# Patient Record
Sex: Female | Born: 1990
Health system: Southern US, Community
[De-identification: ages and names within clinical notes are randomized; demographics above are authoritative.]

## PROBLEM LIST (undated history)

## (undated) DIAGNOSIS — T7840XA Allergy, unspecified, initial encounter: Secondary | ICD-10-CM

## (undated) DIAGNOSIS — R7611 Nonspecific reaction to tuberculin skin test without active tuberculosis: Secondary | ICD-10-CM

## (undated) DIAGNOSIS — N920 Excessive and frequent menstruation with regular cycle: Secondary | ICD-10-CM

## (undated) DIAGNOSIS — D649 Anemia, unspecified: Secondary | ICD-10-CM

## (undated) DIAGNOSIS — D1803 Hemangioma of intra-abdominal structures: Secondary | ICD-10-CM

## (undated) DIAGNOSIS — Z973 Presence of spectacles and contact lenses: Secondary | ICD-10-CM

## (undated) DIAGNOSIS — J302 Other seasonal allergic rhinitis: Secondary | ICD-10-CM

## (undated) DIAGNOSIS — B019 Varicella without complication: Secondary | ICD-10-CM

## (undated) DIAGNOSIS — D573 Sickle-cell trait: Secondary | ICD-10-CM

## (undated) DIAGNOSIS — D509 Iron deficiency anemia, unspecified: Secondary | ICD-10-CM

## (undated) DIAGNOSIS — D251 Intramural leiomyoma of uterus: Secondary | ICD-10-CM

## (undated) DIAGNOSIS — F329 Major depressive disorder, single episode, unspecified: Secondary | ICD-10-CM

## (undated) DIAGNOSIS — F411 Generalized anxiety disorder: Secondary | ICD-10-CM

## (undated) DIAGNOSIS — E282 Polycystic ovarian syndrome: Secondary | ICD-10-CM

## (undated) DIAGNOSIS — F909 Attention-deficit hyperactivity disorder, unspecified type: Secondary | ICD-10-CM

## (undated) HISTORY — DX: Allergy, unspecified, initial encounter: T78.40XA

## (undated) HISTORY — PX: HERNIA REPAIR: SHX51

## (undated) HISTORY — DX: Sickle-cell trait: D57.3

## (undated) HISTORY — DX: Nonspecific reaction to tuberculin skin test without active tuberculosis: R76.11

## (undated) HISTORY — DX: Varicella without complication: B01.9

## (undated) HISTORY — DX: Anemia, unspecified: D64.9

---
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
## 2011-04-18 ENCOUNTER — Other Ambulatory Visit (HOSPITAL_COMMUNITY)
Admission: RE | Admit: 2011-04-18 | Discharge: 2011-04-18 | Disposition: A | Discharge status: Home / Self Care | Payer: BC Managed Care – PPO | Source: Ambulatory Visit | Attending: Family Medicine | Admitting: Family Medicine

## 2011-04-18 ENCOUNTER — Encounter: Payer: Self-pay | Admitting: Family Medicine

## 2011-04-18 ENCOUNTER — Ambulatory Visit (INDEPENDENT_AMBULATORY_CARE_PROVIDER_SITE_OTHER): Payer: BC Managed Care – PPO | Admitting: Family Medicine

## 2011-04-18 VITALS — BP 120/90 | HR 80 | Temp 98.5°F | Resp 12 | Ht 69.5 in | Wt 310.0 lb

## 2011-04-18 DIAGNOSIS — D573 Sickle-cell trait: Secondary | ICD-10-CM | POA: Insufficient documentation

## 2011-04-18 DIAGNOSIS — D649 Anemia, unspecified: Secondary | ICD-10-CM

## 2011-04-18 DIAGNOSIS — R7611 Nonspecific reaction to tuberculin skin test without active tuberculosis: Secondary | ICD-10-CM

## 2011-04-18 DIAGNOSIS — Z01419 Encounter for gynecological examination (general) (routine) without abnormal findings: Secondary | ICD-10-CM | POA: Insufficient documentation

## 2011-04-18 DIAGNOSIS — Z0000 Encounter for general adult medical examination without abnormal findings: Secondary | ICD-10-CM

## 2011-04-18 NOTE — Patient Instructions (Signed)
Work on weight loss and establishing regular exercise Consider low-dose iron supplement Recommend followup in 6 months and consider repeat hemoglobin then

## 2011-04-18 NOTE — Progress Notes (Signed)
  Subjective:    Patient ID: Tara Fox, female    DOB: [birth year], 20 y.o.   MRN: [id number]  HPI  Patient new to establish care and for well visit. Past medical history reviewed. She has history of sickle cell trait. 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 No recent cough or dyspnea. She had inguinal hernia repair 1998. No other chronic problems. Takes no medications. She is sexually active and has never had a Pap smear. No history of STD.  Patient recently graduated from college. Nonsmoker. Rare alcohol use. Works in the Scientific laboratory technician.   family history significant for 2 brothers with sickle cell disease.  Aunt with type 2 diabetes. One of her brothers had a stroke. 2 aunts with breast cancer  Review of Systems  Constitutional: Negative for fever, activity change, appetite change, fatigue and unexpected weight change.  HENT: Negative for hearing loss, ear pain, sore throat and trouble swallowing.   Eyes: Negative for visual disturbance.  Respiratory: Negative for cough and shortness of breath.   Cardiovascular: Negative for chest pain and palpitations.  Gastrointestinal: Negative for abdominal pain, diarrhea, constipation and blood in stool.  Genitourinary: Negative for dysuria and hematuria.  Musculoskeletal: Negative for myalgias, back pain and arthralgias.  Skin: Negative for rash.  Neurological: Negative for dizziness, syncope and headaches.  Hematological: Negative for adenopathy.  Psychiatric/Behavioral: Negative for confusion and dysphoric mood.       Objective:   Physical Exam  Constitutional: She is oriented to person, place, and time. She appears well-developed and well-nourished.  HENT:  Mouth/Throat: Oropharynx is clear and moist.       Moderate cerumen in both ear canals  Eyes: Pupils are equal, round, and reactive to light.  Neck: Neck supple. No thyromegaly present.  Cardiovascular: Normal rate, regular rhythm and normal heart sounds.   No murmur  heard. Pulmonary/Chest: Effort normal and breath sounds normal. No respiratory distress. She has no wheezes. She has no rales.  Genitourinary: Vagina normal and uterus normal. No vaginal discharge found.       No adnexal mass or tenderness.  Pap obtained.  Normal appearing cervix,  Musculoskeletal: She exhibits no edema.  Lymphadenopathy:    She has no cervical adenopathy.  Neurological: She is alert and oriented to person, place, and time.  Skin: No rash noted.  Psychiatric: She has a normal mood and affect. Her behavior is normal.          Assessment & Plan:  Obese 20 year old female here for well visit. Recent lab work reviewed. Establish more regular exercise. Work on weight loss. Consider low-dose iron supplement. Pap smear obtained. Flu vaccine already given. Tetanus up to date Follow up about 6 months and repeat hgb then.

## 2011-04-24 NOTE — Progress Notes (Signed)
Quick Note:  Pt informed on VM ______ 

## 2011-10-03 ENCOUNTER — Encounter: Payer: Self-pay | Admitting: Family Medicine

## 2011-10-03 ENCOUNTER — Ambulatory Visit (INDEPENDENT_AMBULATORY_CARE_PROVIDER_SITE_OTHER): Payer: BC Managed Care – PPO | Admitting: Family Medicine

## 2011-10-03 VITALS — BP 118/90 | Temp 98.6°F | Wt 307.0 lb

## 2011-10-03 DIAGNOSIS — K5289 Other specified noninfective gastroenteritis and colitis: Secondary | ICD-10-CM

## 2011-10-03 DIAGNOSIS — K529 Noninfective gastroenteritis and colitis, unspecified: Secondary | ICD-10-CM

## 2011-10-03 MED ORDER — ONDANSETRON 8 MG PO TBDP: 8.0000 mg | ORAL_TABLET | Freq: Three times a day (TID) | ORAL | Status: AC | PRN

## 2011-10-03 NOTE — Progress Notes (Signed)
  Subjective:    Patient ID: Tara Fox, female    DOB: Jun 15, 1991, 20 y.o.   MRN: [id number]  HPI  Patient seen for acute visit. She had onset of some nausea without vomiting couple days ago. She's had some mild body aches and headache yesterday which improved with Benadryl. She's not had any diarrhea. No abdominal pain. No fever. No history of migraines. No photophobia. Headache yesterday was dull and bifrontal.  No significant headache today. Several coworkers have been sick at her work with a GI-type bug.   Review of Systems  Constitutional: Negative for fever and chills.  HENT: Negative for sore throat.   Respiratory: Negative for shortness of breath.   Cardiovascular: Negative for chest pain.  Gastrointestinal: Positive for nausea. Negative for vomiting and abdominal pain.       Objective:   Physical Exam  Constitutional: She appears well-developed and well-nourished.  HENT:  Mouth/Throat: Oropharynx is clear and moist.  Neck: Neck supple. No thyromegaly present.  Cardiovascular: Normal rate and regular rhythm.   Pulmonary/Chest: Effort normal and breath sounds normal. No respiratory distress. She has no wheezes. She has no rales.  Abdominal: Soft. Bowel sounds are normal. She exhibits no distension and no mass. There is no tenderness. There is no rebound and no guarding.          Assessment & Plan:  Probable gastroenteritis. Symptoms are somewhat improved today. Observe for now. Zofran 8 mg every 8 hours for recurrent nausea or vomiting.

## 2011-10-16 ENCOUNTER — Ambulatory Visit (INDEPENDENT_AMBULATORY_CARE_PROVIDER_SITE_OTHER): Payer: BC Managed Care – PPO | Admitting: Family Medicine

## 2011-10-16 ENCOUNTER — Encounter: Payer: Self-pay | Admitting: Family Medicine

## 2011-10-16 VITALS — BP 140/100 | Temp 98.0°F | Wt 307.0 lb

## 2011-10-16 DIAGNOSIS — R030 Elevated blood-pressure reading, without diagnosis of hypertension: Secondary | ICD-10-CM

## 2011-10-16 DIAGNOSIS — E669 Obesity, unspecified: Secondary | ICD-10-CM | POA: Insufficient documentation

## 2011-10-16 DIAGNOSIS — D649 Anemia, unspecified: Secondary | ICD-10-CM

## 2011-10-16 DIAGNOSIS — D573 Sickle-cell trait: Secondary | ICD-10-CM

## 2011-10-16 LAB — CBC WITH DIFFERENTIAL/PLATELET
Basophils Absolute: 0.1 10*3/uL (ref 0.0–0.1)
HCT: 37.2 % (ref 36.0–46.0)
Lymphs Abs: 2.7 10*3/uL (ref 0.7–4.0)
MCV: 76.3 fl — ABNORMAL LOW (ref 78.0–100.0)
Monocytes Absolute: 0.7 10*3/uL (ref 0.1–1.0)
Platelets: 226 10*3/uL (ref 150.0–400.0)
RDW: 17.6 % — ABNORMAL HIGH (ref 11.5–14.6)

## 2011-10-16 NOTE — Patient Instructions (Signed)
Exercise to Stay Healthy Exercise helps you become and stay healthy. EXERCISE IDEAS AND TIPS Choose exercises that:  You enjoy.   Fit into your day.  You do not need to exercise really hard to be healthy. You can do exercises at a slow or medium level and stay healthy. You can:  Stretch before and after working out.   Try yoga, Pilates, or tai chi.   Lift weights.   Walk fast, swim, jog, run, climb stairs, bicycle, dance, or rollerskate.   Take aerobic classes.  Exercises that burn about 150 calories:  Running 1  miles in 15 minutes.   Playing volleyball for 45 to 60 minutes.   Washing and waxing a car for 45 to 60 minutes.   Playing touch football for 45 minutes.   Walking 1  miles in 35 minutes.   Pushing a stroller 1  miles in 30 minutes.   Playing basketball for 30 minutes.   Raking leaves for 30 minutes.   Bicycling 5 miles in 30 minutes.   Walking 2 miles in 30 minutes.   Dancing for 30 minutes.   Shoveling snow for 15 minutes.   Swimming laps for 20 minutes.   Walking up stairs for 15 minutes.   Bicycling 4 miles in 15 minutes.   Gardening for 30 to 45 minutes.   Jumping rope for 15 minutes.   Washing windows or floors for 45 to 60 minutes.  Document Released: 07/06/2010 Document Revised: 05/23/2011 Document Reviewed: 07/06/2010 [facility] Patient Information 2012 [place], [place].  Our goal for your blood pressure is < 140/90

## 2011-10-16 NOTE — Progress Notes (Signed)
  Subjective:    Patient ID: Tara Fox, female    DOB: 1991/05/22, 21 y.o.   MRN: [id number]  HPI  Patient has history of sickle cell trait and anemia with prior hemoglobin 10.3. Menses are regular. Denies any orthostasis or dizziness. Last visit had gastroenteritis and the symptoms finally resolved. She had a history of elevated borderline blood pressure in the past. No regular exercise. Rare alcohol use. Nonsmoker.  Review of Systems  Constitutional: Negative for fatigue.  Eyes: Negative for visual disturbance.  Respiratory: Negative for cough, chest tightness, shortness of breath and wheezing.   Cardiovascular: Negative for chest pain, palpitations and leg swelling.  Gastrointestinal: Negative for nausea, vomiting and abdominal pain.  Genitourinary: Negative for dysuria.  Neurological: Negative for dizziness, seizures, syncope, weakness, light-headedness and headaches.  Psychiatric/Behavioral: Negative for dysphoric mood.       Objective:   Physical Exam  Constitutional: She appears well-developed and well-nourished.  HENT:  Mouth/Throat: Oropharynx is clear and moist.  Neck: Neck supple. No thyromegaly present.  Cardiovascular: Normal rate and regular rhythm.  Exam reveals no gallop.   Pulmonary/Chest: Effort normal and breath sounds normal. No respiratory distress. She has no wheezes. She has no rales.  Musculoskeletal: She exhibits no edema.  Lymphadenopathy:    She has no cervical adenopathy.  Psychiatric: She has a normal mood and affect. Her behavior is normal.          Assessment & Plan:  #1 history of anemia. Probably related to sickle cell trait. Repeat CBC.  #2 elevated blood pressure. We've recommended weight loss and establishing regular aerobic exercise and followup in 6 months to reassess.  Dietary strategies discussed.

## 2011-10-17 NOTE — Progress Notes (Signed)
Quick Note:  Pt informed on VM ______ 

## 2012-04-17 ENCOUNTER — Ambulatory Visit: Payer: BC Managed Care – PPO | Admitting: Family Medicine

## 2012-07-30 ENCOUNTER — Ambulatory Visit: Payer: BC Managed Care – PPO | Admitting: Family Medicine

## 2013-03-25 ENCOUNTER — Telehealth: Payer: Self-pay

## 2013-03-25 NOTE — Telephone Encounter (Signed)
lmom for pt to call back

## 2013-03-25 NOTE — Telephone Encounter (Signed)
Can you please call and  set appointment for a FMLA form please.

## 2013-03-25 NOTE — Telephone Encounter (Addendum)
Pt will call back to sch.

## 2013-07-05 ENCOUNTER — Ambulatory Visit (INDEPENDENT_AMBULATORY_CARE_PROVIDER_SITE_OTHER): Payer: BC Managed Care – PPO | Admitting: Family Medicine

## 2013-07-05 ENCOUNTER — Encounter: Payer: Self-pay | Admitting: Family Medicine

## 2013-07-05 VITALS — BP 128/80 | HR 87 | Temp 98.7°F | Wt 321.0 lb

## 2013-07-05 DIAGNOSIS — J3089 Other allergic rhinitis: Secondary | ICD-10-CM

## 2013-07-05 DIAGNOSIS — J309 Allergic rhinitis, unspecified: Secondary | ICD-10-CM

## 2013-07-05 NOTE — Progress Notes (Signed)
Pre visit review using our clinic review tool, if applicable. No additional management support is needed unless otherwise documented below in the visit note. 

## 2013-07-05 NOTE — Progress Notes (Signed)
   Subjective:    Patient ID: Tara Fox, female    DOB: 04/30/1991, 23 y.o.   MRN: [id number]  HPI Here  for FMLA completion. Between May and September of 2014 she missed 10 days or more due to allergy complications. She saw allergist and tested positive for dust mites. 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 She is now treated with Allegra and allergy eyedrops. For a while she was taking Nasonex.  Her major symptoms are rhinorrhea, frequent headaches, photophobia, frequent sneezing and eye symptoms. She had difficulties focusing on computer and interpersonal communications during flareups. She had quite severe headaches at times. Her symptoms are improving with control this time. She is requesting FMLA forms be completed. She has no history of asthma no recent coughing. She brings in copy for allergy testing recently which confirms only dust mite allergen  Past Medical History  Diagnosis Date  . Chicken pox   . Sickle cell trait   . 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 Hernia repair  1988    reports that she has never smoked. She does not have any smokeless tobacco history on file. Her alcohol and drug histories are not on file. family history includes Alcohol abuse in her father; Cancer in her maternal aunt; Diabetes in her maternal aunt; Sickle cell anemia in her brother. No Known Allergies    Review of Systems  Constitutional: Negative for fever and chills.  HENT: Positive for congestion.   Respiratory: Negative for cough, shortness of breath and wheezing.        Objective:   Physical Exam  Constitutional: She appears well-developed and well-nourished.  HENT:  Right Ear: External ear normal.  Left Ear: External ear normal.  Nose: Nose normal.  Mouth/Throat: Oropharynx is clear and moist.  Neck: Neck supple.  Cardiovascular: Normal rate and regular rhythm.   Pulmonary/Chest: Effort normal and  breath sounds normal. No respiratory distress. She has no wheezes. She has no rales.  Lymphadenopathy:    She has no cervical adenopathy.          Assessment & Plan:  Allergic rhinitis, perennial dust mite. Continue Allegra. Start back nasal steroid if not controlled with oral antihistamine. FMLA forms completed. We mentioned other potential therapies and more aggressive control if requiring any further work absence.

## 2013-12-09 ENCOUNTER — Encounter: Payer: Self-pay | Admitting: Family Medicine

## 2013-12-09 ENCOUNTER — Ambulatory Visit (INDEPENDENT_AMBULATORY_CARE_PROVIDER_SITE_OTHER): Payer: BC Managed Care – PPO | Admitting: Family Medicine

## 2013-12-09 VITALS — BP 130/90 | HR 108 | Temp 98.2°F | Wt 314.0 lb

## 2013-12-09 DIAGNOSIS — J309 Allergic rhinitis, unspecified: Secondary | ICD-10-CM

## 2013-12-09 DIAGNOSIS — J3089 Other allergic rhinitis: Secondary | ICD-10-CM

## 2013-12-09 MED ORDER — ONDANSETRON 8 MG PO TBDP: 8.0000 mg | ORAL_TABLET | Freq: Three times a day (TID) | ORAL | Status: DC | PRN

## 2013-12-09 MED ORDER — AZELASTINE HCL 0.1 % NA SOLN: 2.0000 | Freq: Two times a day (BID) | NASAL | Status: DC

## 2013-12-09 MED ORDER — MONTELUKAST SODIUM 10 MG PO TABS: 10.0000 mg | ORAL_TABLET | Freq: Every day | ORAL | Status: DC

## 2013-12-09 NOTE — Patient Instructions (Signed)

## 2013-12-09 NOTE — Progress Notes (Signed)
Pre visit review using our clinic review tool, if applicable. No additional management support is needed unless otherwise documented below in the visit note. 

## 2013-12-09 NOTE — Progress Notes (Signed)
   Subjective:    Patient ID: Tara Fox, female    DOB: [birth year], 23 y.o.   MRN: [id number]  HPI Patient seen with several a history of frequent postnasal drip, sneezing, watery itchy eyes. She has occasional gagging and nausea which may be postnasal drip related. In process of moving and around lots of dust which she thinks is a trigger. No fever. No chills. No abdominal pain. Has tried Allegra without much relief. Also took some DayQuil without relief. Previous his tried Nasonex without much relief.  Past Medical History  Diagnosis Date  . Chicken pox   . Sickle cell trait   . 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 Hernia repair  1988    reports that she has never smoked. She does not have any smokeless tobacco history on file. Her alcohol and drug histories are not on file. family history includes Alcohol abuse in her father; Cancer in her maternal aunt; Diabetes in her maternal aunt; Sickle cell anemia in her brother. No Known Allergies    Review of Systems  Constitutional: Negative for fever and chills.  HENT: Positive for congestion and sinus pressure.   Respiratory: Positive for cough. Negative for shortness of breath and wheezing.   Gastrointestinal: Positive for nausea. Negative for vomiting.       Objective:   Physical Exam  Constitutional: She appears well-developed and well-nourished.  HENT:  Right Ear: External ear normal.  Left Ear: External ear normal.  Mouth/Throat: Oropharynx is clear and moist.  Neck: Neck supple.  Cardiovascular: Normal rate and regular rhythm.   Pulmonary/Chest: Effort normal and breath sounds normal. No respiratory distress. She has no wheezes. She has no rales.  Lymphadenopathy:    She has no cervical adenopathy.          Assessment & Plan:  Allergic rhinitis. Not controlled with antihistamines orally. Try Astelin nasal 1-2 sprays per nostril twice daily. Consider addition of Singulair 10  mg once daily. Continue Allegra.

## 2013-12-28 ENCOUNTER — Telehealth: Payer: Self-pay | Admitting: Family Medicine

## 2013-12-28 NOTE — Telephone Encounter (Signed)
Left message that FMLA were faxed.

## 2013-12-28 NOTE — Telephone Encounter (Signed)
Pt called to check to status of her FMLA papers faxed last week??

## 2014-10-03 ENCOUNTER — Other Ambulatory Visit (HOSPITAL_COMMUNITY)
Admission: RE | Admit: 2014-10-03 | Discharge: 2014-10-03 | Disposition: A | Discharge status: Home / Self Care | Payer: BLUE CROSS/BLUE SHIELD | Source: Ambulatory Visit | Attending: Family Medicine | Admitting: Family Medicine

## 2014-10-03 ENCOUNTER — Encounter: Payer: Self-pay | Admitting: Family Medicine

## 2014-10-03 ENCOUNTER — Ambulatory Visit (INDEPENDENT_AMBULATORY_CARE_PROVIDER_SITE_OTHER): Payer: BLUE CROSS/BLUE SHIELD | Admitting: Family Medicine

## 2014-10-03 VITALS — BP 124/80 | HR 92 | Temp 97.7°F | Ht 69.0 in | Wt 313.0 lb

## 2014-10-03 DIAGNOSIS — Z01419 Encounter for gynecological examination (general) (routine) without abnormal findings: Secondary | ICD-10-CM | POA: Diagnosis not present

## 2014-10-03 DIAGNOSIS — Z0000 Encounter for general adult medical examination without abnormal findings: Secondary | ICD-10-CM | POA: Diagnosis not present

## 2014-10-03 LAB — CBC WITH DIFFERENTIAL/PLATELET
BASOS ABS: 0 10*3/uL (ref 0.0–0.1)
Basophils Relative: 0.3 % (ref 0.0–3.0)
EOS ABS: 0.1 10*3/uL (ref 0.0–0.7)
Eosinophils Relative: 2.5 % (ref 0.0–5.0)
HCT: 36.4 % (ref 36.0–46.0)
Hemoglobin: 11.7 g/dL — ABNORMAL LOW (ref 12.0–15.0)
LYMPHS ABS: 2.1 10*3/uL (ref 0.7–4.0)
Lymphocytes Relative: 35.6 % (ref 12.0–46.0)
MCHC: 32.2 g/dL (ref 30.0–36.0)
MCV: 70.6 fl — ABNORMAL LOW (ref 78.0–100.0)
MONOS PCT: 6 % (ref 3.0–12.0)
Monocytes Absolute: 0.4 10*3/uL (ref 0.1–1.0)
NEUTROS PCT: 55.6 % (ref 43.0–77.0)
Neutro Abs: 3.3 10*3/uL (ref 1.4–7.7)
PLATELETS: 283 10*3/uL (ref 150.0–400.0)
RBC: 5.16 Mil/uL — ABNORMAL HIGH (ref 3.87–5.11)
RDW: 19 % — AB (ref 11.5–15.5)
WBC: 6 10*3/uL (ref 4.0–10.5)

## 2014-10-03 LAB — BASIC METABOLIC PANEL
BUN: 9 mg/dL (ref 6–23)
CHLORIDE: 106 meq/L (ref 96–112)
CO2: 24 meq/L (ref 19–32)
CREATININE: 0.97 mg/dL (ref 0.40–1.20)
Calcium: 9.7 mg/dL (ref 8.4–10.5)
GFR: 89.43 mL/min (ref >60.00)
GLUCOSE: 94 mg/dL (ref 70–99)
POTASSIUM: 3.8 meq/L (ref 3.5–5.1)
Sodium: 137 mEq/L (ref 135–145)

## 2014-10-03 LAB — HEPATIC FUNCTION PANEL
ALT: 13 U/L (ref 0–35)
AST: 19 U/L (ref 0–37)
Albumin: 3.9 g/dL (ref 3.5–5.2)
Alkaline Phosphatase: 77 U/L (ref 39–117)
Bilirubin, Direct: 0.1 mg/dL (ref 0.0–0.3)
TOTAL PROTEIN: 7.5 g/dL (ref 6.0–8.3)
Total Bilirubin: 0.4 mg/dL (ref 0.2–1.2)

## 2014-10-03 LAB — LIPID PANEL
CHOL/HDL RATIO: 3
Cholesterol: 125 mg/dL (ref 0–200)
HDL: 39.1 mg/dL (ref >39.00)
LDL Cholesterol: 70 mg/dL (ref 0–99)
NONHDL: 85.9
Triglycerides: 78 mg/dL (ref 0.0–149.0)
VLDL: 15.6 mg/dL (ref 0.0–40.0)

## 2014-10-03 LAB — TSH: TSH: 3.12 u[IU]/mL (ref 0.35–4.50)

## 2014-10-03 NOTE — Patient Instructions (Signed)
Try to lose some weight Establish regular physical activity We will call you with lab results from today.

## 2014-10-03 NOTE — Progress Notes (Signed)
Pre visit review using our clinic review tool, if applicable. No additional management support is needed unless otherwise documented below in the visit note. 

## 2014-10-03 NOTE — Progress Notes (Signed)
   Subjective:    Patient ID: Tara Fox, female    DOB: 1991-01-29, 24 y.o.   MRN: [id number]  HPI Patient here for complete physical. Takes no regular medications. She's not had Pap smear for years. She is sexually active with 1 partner. She uses condoms. She is reluctant to use hormonal therapy because of history of sickle cell trait. She's not had any clotting issues herself. Nonsmoker. No consistent exercise. Tetanus up-to-date. She thinks she had previous HPV vaccine series.  Past Medical History  Diagnosis Date  . Chicken pox   . Sickle cell trait   . 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 Hernia repair  1988    reports that she has never smoked. She does not have any smokeless tobacco history on file. Her alcohol and drug histories are not on file. family history includes Alcohol abuse in her father; Cancer in her maternal aunt; Diabetes in her maternal aunt; Sickle cell anemia in her brother. No Known Allergies    Review of Systems  Constitutional: Negative for fever, activity change, appetite change, fatigue and unexpected weight change.  HENT: Negative for hearing loss, sore throat and trouble swallowing.   Respiratory: Negative for cough, shortness of breath and wheezing.   Cardiovascular: Negative for chest pain, palpitations and leg swelling.  Gastrointestinal: Negative for nausea, vomiting, abdominal pain, blood in stool and abdominal distention.  Endocrine: Negative for polydipsia and polyuria.  Genitourinary: Negative for dysuria and hematuria.  Musculoskeletal: Negative for myalgias and gait problem.  Skin: Negative for rash.  Neurological: Negative for dizziness, syncope, weakness and headaches.  Hematological: Negative for adenopathy.  Psychiatric/Behavioral: Negative for confusion and dysphoric mood.       Objective:   Physical Exam  Constitutional: She is oriented to person, place, and time. She appears well-developed  and well-nourished.  HENT:  Head: Normocephalic and atraumatic.  Eyes: EOM are normal. Pupils are equal, round, and reactive to light.  Neck: Normal range of motion. Neck supple. No thyromegaly present.  Cardiovascular: Normal rate, regular rhythm and normal heart sounds.   No murmur heard. Pulmonary/Chest: Breath sounds normal. No respiratory distress. She has no wheezes. She has no rales.  Abdominal: Soft. Bowel sounds are normal. She exhibits no distension and no mass. There is no tenderness. There is no rebound and no guarding.  Genitourinary: Vagina normal.  She has some thick white mucus vaginal vault. Cervix is normal in appearance. Pap smear obtained. Bimanual no uterine mass or tenderness noted. No adnexal masses. She has very large breasts but no masses are noted. No nipple inversion. No skin dimpling.  Musculoskeletal: Normal range of motion. She exhibits no edema.  Lymphadenopathy:    She has no cervical adenopathy.  Neurological: She is alert and oriented to person, place, and time. She displays normal reflexes. No cranial nerve deficit.  Skin: No rash noted.  Psychiatric: She has a normal mood and affect. Her behavior is normal. Judgment and thought content normal.          Assessment & Plan:  Complete physical. She is strongly advised to lose some weight. Pap smear obtained. Screening labs obtained. Tetanus booster in 2 years. We discussed birth control and she is not interested in hormonal therapies.

## 2014-10-05 LAB — CYTOLOGY - PAP

## 2016-05-16 ENCOUNTER — Ambulatory Visit (INDEPENDENT_AMBULATORY_CARE_PROVIDER_SITE_OTHER): Payer: BLUE CROSS/BLUE SHIELD | Admitting: Family Medicine

## 2016-05-16 VITALS — BP 132/88 | HR 98 | Temp 98.2°F | Wt 325.0 lb

## 2016-05-16 DIAGNOSIS — H6591 Unspecified nonsuppurative otitis media, right ear: Secondary | ICD-10-CM

## 2016-05-16 DIAGNOSIS — H6121 Impacted cerumen, right ear: Secondary | ICD-10-CM

## 2016-05-16 MED ORDER — AMOXICILLIN 500 MG PO CAPS: 500.0000 mg | ORAL_CAPSULE | Freq: Three times a day (TID) | ORAL | 0 refills | Status: DC

## 2016-05-16 NOTE — Patient Instructions (Signed)
Please take medication as directed. You may use ibuprofen or tylenol for discomfort. If symptoms do not improve, worsen, or you develop a fever >100, please follow up for further evaluation.    Otitis Media, Adult Otitis media is redness, soreness, and puffiness (swelling) in the space just behind your eardrum (middle ear). It may be caused by allergies or infection. It often happens along with a cold. Follow these instructions at home:  Take your medicine as told. Finish it even if you start to feel better.  Only take over-the-counter or prescription medicines for pain, discomfort, or fever as told by your doctor.  Follow up with your doctor as told. Contact a doctor if:  You have otitis media only in one ear, or bleeding from your nose, or both.  You notice a lump on your neck.  You are not getting better in 3-5 days.  You feel worse instead of better. Get help right away if:  You have pain that is not helped with medicine.  You have puffiness, redness, or pain around your ear.  You get a stiff neck.  You cannot move part of your face (paralysis).  You notice that the bone behind your ear hurts when you touch it. This information is not intended to replace advice given to you by your health care provider. Make sure you discuss any questions you have with your health care provider. Document Released: 11/20/2007 Document Revised: 11/09/2015 Document Reviewed: 12/29/2012 Elsevier Interactive Patient Education  2017 [employer].

## 2016-05-16 NOTE — Progress Notes (Signed)
Pre visit review using our clinic review tool, if applicable. No additional management support is needed unless otherwise documented below in the visit note. 

## 2016-05-16 NOTE — Progress Notes (Signed)
Subjective:    Patient ID: Tara Fox, female    DOB: 17-Jan-1991, 25 y.o.   MRN: [id number]  HPI  Tara Fox is a 25 year old female with ear pain in the right ear that started 2 days ago.  Associated symptom of sore throat that started 2 days which has resolved, nasal congestion, rhinitis with yellow drainage, productive cough with yellow sputum.  Denies chills, fever, sweats, N/V/D, myalgias. Influenza vaccine is UTD.  Treatment at home with Nyquil has provided limited benefit. Denies history of asthma/bronchitis. Denies recent antibiotic use.  Recent sick contact exposure with 17 year old niece with similar symptoms.  Review of Systems  Constitutional: Negative for chills, fatigue and fever.  HENT: Positive for congestion, rhinorrhea and sore throat.   Respiratory: Positive for cough. Negative for shortness of breath and wheezing.   Cardiovascular: Negative for chest pain and palpitations.  Gastrointestinal: Negative for abdominal pain, constipation, diarrhea, nausea and vomiting.  Musculoskeletal: Negative for myalgias.  Neurological: Negative for dizziness, light-headedness and headaches.   Past Medical History:  Diagnosis Date  . Chicken pox   . 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 Sickle cell trait      Social History   Social History  . Marital status: Single    Spouse name: N/A  . Number of children: N/A  . Years of education: N/A   Occupational History  . Not on file.   Social History Main Topics  . Smoking status: Never Smoker  . Smokeless tobacco: Not on file  . Alcohol use Not on file  . Drug use: Unknown  . Sexual activity: Not on file   Other Topics Concern  . Not on file   Social History Narrative  . No narrative on file    Past Surgical History:  Procedure Laterality Date  . HERNIA REPAIR  1988    Family History  Problem Relation Age of Onset  . Alcohol abuse Father   . Sickle cell anemia Brother   . Cancer Maternal Aunt     breast CA, 2 aunts  .  Diabetes Maternal Aunt     No Known Allergies  Current Outpatient Prescriptions on File Prior to Visit  Medication Sig Dispense Refill  . azelastine (ASTELIN) 0.1 % nasal spray Place 2 sprays into both nostrils 2 (two) times daily. Use in each nostril as directed 30 mL 12  . ondansetron (ZOFRAN ODT) 8 MG disintegrating tablet Take 1 tablet (8 mg total) by mouth every 8 (eight) hours as needed for nausea or vomiting. 20 tablet 1  . montelukast (SINGULAIR) 10 MG tablet Take 1 tablet (10 mg total) by mouth at bedtime. (Patient not taking: Reported on 05/16/2016) 30 tablet 11   No current facility-administered medications on file prior to visit.     BP 132/88   Pulse 98   Temp 98.2 F (36.8 C) (Oral)   Wt (!) 325 lb (147.4 kg)   SpO2 98%   BMI 47.99 kg/m         Objective:   Physical Exam  Constitutional: Tara Fox is oriented to person, place, and time. Tara Fox appears well-developed and well-nourished.  HENT:  Right Ear: Tympanic membrane is erythematous and bulging.  Left Ear: Tympanic membrane normal.  Nose: Rhinorrhea present. Right sinus exhibits no maxillary sinus tenderness and no frontal sinus tenderness. Left sinus exhibits no maxillary sinus tenderness and no frontal sinus tenderness.  Mouth/Throat: Mucous membranes are normal. No oropharyngeal exudate or posterior oropharyngeal erythema.  Cerumen  impaction right TM. Ear irrigation completed and TM visualized.   Eyes: Pupils are equal, round, and reactive to light. No scleral icterus.  Neck: Neck supple.  Cardiovascular: Normal rate and regular rhythm.   Pulmonary/Chest: Effort normal and breath sounds normal. Tara Fox has no wheezes. Tara Fox has no rales.  Lymphadenopathy:    Tara Fox has cervical adenopathy.  Neurological: Tara Fox is alert and oriented to person, place, and time. Coordination normal.  Skin: Skin is warm and dry. No rash noted.        Assessment & Plan:  1. Right non-suppurative otitis media Exam and history support  treatment for AOM. Advised patient to follow up if symptoms do not improve in 2 to 3 days, worsen, or Tara Fox develops a fever >101. Ibuprofen or Tylenol for pain - amoxicillin (AMOXIL) 500 MG capsule; Take 1 capsule (500 mg total) by mouth 3 (three) times daily.  Dispense: 21 capsule; Refill: 0 - Ear Lavage   2. Impacted cerumen of right ear Lavage completed without difficulty. Able to visualize right TM   Delano Metz, FNP-C

## 2018-01-20 LAB — HEPATIC FUNCTION PANEL
ALT: 9 (ref 7–35)
AST: 15 (ref 13–35)

## 2018-01-20 LAB — BASIC METABOLIC PANEL
Glucose: 90
Potassium: 4 (ref 3.4–5.3)
SODIUM: 140 (ref 137–147)

## 2018-01-20 LAB — LIPID PANEL
Cholesterol: 120 (ref 0–200)
HDL: 37 (ref 35–70)
LDL Cholesterol: 70
TRIGLYCERIDES: 64 (ref 40–160)

## 2018-01-20 LAB — IRON,TIBC AND FERRITIN PANEL: Iron: 22

## 2018-01-27 ENCOUNTER — Encounter: Payer: Self-pay | Admitting: Family Medicine

## 2018-09-28 ENCOUNTER — Telehealth: Payer: Self-pay | Admitting: *Deleted

## 2018-09-28 NOTE — Telephone Encounter (Signed)
Copied from [place] [id number]. Topic: General - Other >> Sep 25, 2018  8:15 AM Alanda Slim E wrote: Reason for CRM: Pt needs her immunization records faxed over to [facility] [phone]

## 2018-09-28 NOTE — Telephone Encounter (Signed)
Can you send this for patient? Thank you!

## 2018-10-02 NOTE — Telephone Encounter (Signed)
Sent patient immunizations records to the address on file. I am not able to fax them to [facility].

## 2019-09-16 ENCOUNTER — Telehealth: Payer: Self-pay | Admitting: Family Medicine

## 2019-09-16 ENCOUNTER — Other Ambulatory Visit: Payer: Self-pay

## 2019-09-16 ENCOUNTER — Ambulatory Visit (INDEPENDENT_AMBULATORY_CARE_PROVIDER_SITE_OTHER)
Admission: RE | Admit: 2019-09-16 | Discharge: 2019-09-16 | Disposition: A | Discharge status: Home / Self Care | Payer: No Typology Code available for payment source | Source: Ambulatory Visit

## 2019-09-16 DIAGNOSIS — R112 Nausea with vomiting, unspecified: Secondary | ICD-10-CM

## 2019-09-16 MED ORDER — ONDANSETRON HCL 4 MG PO TABS: 4.0000 mg | ORAL_TABLET | Freq: Four times a day (QID) | ORAL | 0 refills | Status: DC | PRN

## 2019-09-16 MED FILL — ONDANSETRON HCL 4 MG TABLET: 4 | 3 days supply | Qty: 12 | Fill #0

## 2019-09-16 NOTE — ED Provider Notes (Signed)
Virtual Visit via Video Note:  Tara Fox  initiated request for Telemedicine visit with [facility] team. I connected with Tara Fox  on 09/16/2019 at 10:40 AM  for a synchronized telemedicine visit using a video enabled HIPPA compliant telemedicine application. I verified that I am speaking with [employer]  using two identifiers. Sharion Balloon, NP  was physically located in a [facility]gent care site and Latwanda Hepburn was located at a different location.   The limitations of evaluation and management by telemedicine as well as the availability of in-person appointments were discussed. Patient was informed that she  may incur a bill ( including co-pay) for this virtual visit encounter. [employer]  expressed understanding and gave verbal consent to proceed with virtual visit.     History of Present Illness:Tara Fox  is a 29 y.o. female presents for evaluation of nausea and vomiting (last emesis was on 09/13/2019).  She also reports LLQ "bloated feeling" with palpation; no actual abdominal pain.  Last bowel movement yesterday.  She denies fever, chills, dysuria, vaginal discharge, pelvic pain, or other symptoms.  She denies pregnancy or breastfeeding.  She requests a prescription for Zofran and a work note.     No Known Allergies   Past Medical History:  Diagnosis Date  . Chicken pox   . 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 Sickle cell trait      Social History   Tobacco Use  . Smoking status: Never Smoker  Substance Use Topics  . Alcohol use: Not on file  . Drug use: Not on file   ROS: as stated in HPI.  All other systems reviewed and negative.      Observations/Objective: Physical Exam  VITALS: Patient denies fever. GENERAL: Alert, appears well and in no acute distress. HEENT: Atraumatic. NECK: Normal movements of the head and neck. CARDIOPULMONARY: No increased WOB. Speaking in clear sentences. I:E ratio WNL.  MS: Moves all visible extremities without noticeable  abnormality. PSYCH: Pleasant and cooperative, well-groomed. Speech normal rate and rhythm. Affect is appropriate. Insight and judgement are appropriate. Attention is focused, linear, and appropriate.  NEURO: CN grossly intact. Oriented as arrived to appointment on time with no prompting. Moves both UE equally.  SKIN: No obvious lesions, wounds, erythema, or cyanosis noted on face or hands.   Assessment and Plan:    ICD-10-CM   1. Non-intractable vomiting with nausea, unspecified vomiting type  R11.2        Follow Up Instructions: Treating with Zofran as needed.  Instructed patient to keep yourself hydrated with clear liquids.  Discussed that she should go to the emergency department if she develops severe abdominal pain, intractable vomiting, fever, or other concerning symptoms.  Patient agrees to plan of care.      I discussed the assessment and treatment plan with the patient. The patient was provided an opportunity to ask questions and all were answered. The patient agreed with the plan and demonstrated an understanding of the instructions.   The patient was advised to call back or seek an in-person evaluation if the symptoms worsen or if the condition fails to improve as anticipated.      Sharion Balloon, NP  09/16/2019 10:40 AM         Sharion Balloon, NP 09/16/19 1040

## 2019-09-16 NOTE — Discharge Instructions (Addendum)
Take the antinausea medication as directed.    Keep yourself hydrated with clear liquids, such as water, Gatorade, Pedialyte, Sprite, or ginger ale.    Go to the emergency department if you have increased abdominal pain or vomiting; Or if you develop new symptoms such as fever.

## 2019-09-16 NOTE — Telephone Encounter (Signed)
Patient has not been seen since 2016 and has an appointment on 09/20/2019 and will have to wait until after her appointment and discuss with Dr. Elease Hashimoto.

## 2019-09-16 NOTE — Telephone Encounter (Signed)
Medication: Zofran  Pharmacy: [facility]

## 2019-09-20 ENCOUNTER — Telehealth: Payer: No Typology Code available for payment source | Admitting: Family Medicine

## 2019-09-22 ENCOUNTER — Ambulatory Visit: Payer: BLUE CROSS/BLUE SHIELD | Attending: Internal Medicine

## 2019-09-22 DIAGNOSIS — Z20822 Contact with and (suspected) exposure to covid-19: Secondary | ICD-10-CM

## 2019-09-23 LAB — SARS-COV-2, NAA 2 DAY TAT

## 2019-09-23 LAB — NOVEL CORONAVIRUS, NAA: SARS-CoV-2, NAA: NOT DETECTED

## 2019-10-11 LAB — RESULTS CONSOLE HPV: CHL HPV: NEGATIVE

## 2019-10-11 LAB — HM PAP SMEAR

## 2019-10-19 MED FILL — FERROUS SULFATE 325 MG TAB: 325 (65 FE) | 90 days supply | Qty: 90 | Fill #0

## 2019-10-21 MED FILL — METRONIDAZOLE 500 MG TABS: 500 | 7 days supply | Qty: 14 | Fill #0

## 2019-11-22 ENCOUNTER — Telehealth: Payer: No Typology Code available for payment source | Admitting: Emergency Medicine

## 2019-11-22 DIAGNOSIS — J029 Acute pharyngitis, unspecified: Secondary | ICD-10-CM | POA: Diagnosis not present

## 2019-11-22 NOTE — Progress Notes (Signed)
We are sorry that you are not feeling well.  Here is how we plan to help!  Your symptoms indicate a likely viral infection (Pharyngitis).  It is very unlikely to be strep throat given that you don't have a fever.   Pharyngitis is inflammation in the back of the throat which can cause a sore throat, scratchiness and sometimes difficulty swallowing.   Pharyngitis is typically caused by a respiratory virus and will just run its course.  Please keep in mind that your symptoms could last up to 10 days.  For throat pain, we recommend over the counter oral pain relief medications such as acetaminophen or aspirin, or anti-inflammatory medications such as ibuprofen or naproxen sodium.  Topical treatments such as oral throat lozenges or sprays may be used as needed.  Avoid close contact with loved ones, especially the very young and elderly.  Remember to wash your hands thoroughly throughout the day as this is the number one way to prevent the spread of infection and wipe down door knobs and counters with disinfectant.  After careful review of your answers, I would not recommend and antibiotic for your condition.  Antibiotics should not be used to treat conditions that we suspect are caused by viruses like the virus that causes the common cold or flu. However, some people can have Strep with atypical symptoms. You may need formal testing in clinic or office to confirm if your symptoms continue or worsen.  Providers prescribe antibiotics to treat infections caused by bacteria. Antibiotics are very powerful in treating bacterial infections when they are used properly.  To maintain their effectiveness, they should be used only when necessary.  Overuse of antibiotics has resulted in the development of super bugs that are resistant to treatment!    Home Care:  Only take medications as instructed by your medical team.  Do not drink alcohol while taking these medications.  A steam or ultrasonic humidifier can help  congestion.  You can place a towel over your head and breathe in the steam from hot water coming from a faucet.  Avoid close contacts especially the very young and the elderly.  Cover your mouth when you cough or sneeze.  Always remember to wash your hands.  Get Help Right Away If:  You develop worsening fever or throat pain.  You develop a severe head ache or visual changes.  Your symptoms persist after you have completed your treatment plan.  Make sure you  Understand these instructions.  Will watch your condition.  Will get help right away if you are not doing well or get worse.  Your e-visit answers were reviewed by a board certified advanced clinical practitioner to complete your personal care plan.  Depending on the condition, your plan could have included both over the counter or prescription medications.  If there is a problem please reply  once you have received a response from your provider.  Your safety is important to [place].  If you have drug allergies check your prescription carefully.    You can use MyChart to ask questions about todays visit, request a non-urgent call back, or ask for a work or school excuse for 24 hours related to this e-Visit. If it has been greater than 24 hours you will need to follow up with your provider, or enter a new e-Visit to address those concerns.  You will get an e-mail in the next two days asking about your experience.  I hope that your e-visit has been valuable  and will speed your recovery. Thank you for using e-visits.   Approximately 5 minutes was used in reviewing the patient's chart, questionnaire, prescribing medications, and documentation.

## 2020-02-08 MED FILL — FERROUS SULFATE 325 MG TAB: 325 (65 FE) | 90 days supply | Qty: 90 | Fill #1

## 2020-03-02 ENCOUNTER — Telehealth: Payer: Self-pay | Admitting: Family Medicine

## 2020-03-02 NOTE — Telephone Encounter (Signed)
No longer needed

## 2020-06-24 MED FILL — FERROUS SULFATE 325 MG TAB: 325 (65 FE) | 90 days supply | Qty: 90 | Fill #2

## 2020-06-26 ENCOUNTER — Telehealth: Payer: No Typology Code available for payment source | Admitting: Nurse Practitioner

## 2020-06-26 DIAGNOSIS — J329 Chronic sinusitis, unspecified: Secondary | ICD-10-CM

## 2020-06-26 DIAGNOSIS — B9789 Other viral agents as the cause of diseases classified elsewhere: Secondary | ICD-10-CM

## 2020-06-27 ENCOUNTER — Ambulatory Visit: Payer: Self-pay | Admitting: Family Medicine

## 2020-06-27 ENCOUNTER — Other Ambulatory Visit: Payer: Self-pay | Admitting: Nurse Practitioner

## 2020-06-27 MED ORDER — FLUTICASONE PROPIONATE 50 MCG/ACT NA SUSP: 2.0000 | Freq: Every day | NASAL | 6 refills | Status: DC

## 2020-06-27 NOTE — Progress Notes (Signed)

## 2020-08-11 ENCOUNTER — Ambulatory Visit (INDEPENDENT_AMBULATORY_CARE_PROVIDER_SITE_OTHER): Payer: No Typology Code available for payment source | Admitting: Physician Assistant

## 2020-08-11 ENCOUNTER — Encounter: Payer: Self-pay | Admitting: Physician Assistant

## 2020-08-11 ENCOUNTER — Other Ambulatory Visit: Payer: Self-pay

## 2020-08-11 VITALS — BP 130/98 | HR 92 | Temp 97.7°F | Ht 69.0 in | Wt 321.0 lb

## 2020-08-11 DIAGNOSIS — R030 Elevated blood-pressure reading, without diagnosis of hypertension: Secondary | ICD-10-CM

## 2020-08-11 LAB — COMPREHENSIVE METABOLIC PANEL
ALT: 10 U/L (ref 0–35)
AST: 13 U/L (ref 0–37)
Albumin: 3.8 g/dL (ref 3.5–5.2)
Alkaline Phosphatase: 49 U/L (ref 39–117)
BUN: 11 mg/dL (ref 6–23)
CO2: 26 mEq/L (ref 19–32)
Calcium: 9.3 mg/dL (ref 8.4–10.5)
Chloride: 106 mEq/L (ref 96–112)
Creatinine, Ser: 1.07 mg/dL (ref 0.40–1.20)
GFR: 69.15 mL/min (ref >60.00)
Glucose, Bld: 95 mg/dL (ref 70–99)
Potassium: 4.1 mEq/L (ref 3.5–5.1)
Sodium: 140 mEq/L (ref 135–145)
Total Bilirubin: 0.4 mg/dL (ref 0.2–1.2)
Total Protein: 7.2 g/dL (ref 6.0–8.3)

## 2020-08-11 LAB — LIPID PANEL
Cholesterol: 114 mg/dL (ref 0–200)
HDL: 40.9 mg/dL (ref >39.00)
LDL Cholesterol: 61 mg/dL (ref 0–99)
NonHDL: 72.63
Total CHOL/HDL Ratio: 3
Triglycerides: 58 mg/dL (ref 0.0–149.0)
VLDL: 11.6 mg/dL (ref 0.0–40.0)

## 2020-08-11 LAB — CBC WITH DIFFERENTIAL/PLATELET
Basophils Absolute: 0.1 10*3/uL (ref 0.0–0.1)
Basophils Relative: 1.1 % (ref 0.0–3.0)
Eosinophils Absolute: 0 10*3/uL (ref 0.0–0.7)
Eosinophils Relative: 0.8 % (ref 0.0–5.0)
HCT: 38.5 % (ref 36.0–46.0)
Hemoglobin: 12.7 g/dL (ref 12.0–15.0)
Lymphocytes Relative: 28.4 % (ref 12.0–46.0)
Lymphs Abs: 1.6 10*3/uL (ref 0.7–4.0)
MCHC: 32.9 g/dL (ref 30.0–36.0)
MCV: 80.5 fl (ref 78.0–100.0)
Monocytes Absolute: 0.4 10*3/uL (ref 0.1–1.0)
Monocytes Relative: 7.1 % (ref 3.0–12.0)
Neutro Abs: 3.5 10*3/uL (ref 1.4–7.7)
Neutrophils Relative %: 62.6 % (ref 43.0–77.0)
Platelets: 226 10*3/uL (ref 150.0–400.0)
RBC: 4.78 Mil/uL (ref 3.87–5.11)
RDW: 16.4 % — ABNORMAL HIGH (ref 11.5–15.5)
WBC: 5.6 10*3/uL (ref 4.0–10.5)

## 2020-08-11 LAB — TSH: TSH: 3.83 u[IU]/mL (ref 0.35–4.50)

## 2020-08-11 NOTE — Patient Instructions (Signed)
It was great to see you!  Update your blood work today.  Work on bringing meals from home 1-2 times per week during work (with goal of ordering out less while at work.)  Please send me a message in the next 2-4 weeks (or make an office visit) to see how your blood pressure has been doing. My goal is <130/80.  Take care,  Inda Coke PA-C

## 2020-08-11 NOTE — Progress Notes (Signed)
Tara Fox is a 30 y.o. female is here to establish care and discuss blood pressure.  I acted as a Education administrator for [employer], PA-C Anselmo Pickler, LPN   History of Present Illness:   Chief Complaint  Patient presents with  . Establish Care  . c/o elevated blood pressure    HPI  Elevated blood pressure Pt was seen at the [facility] in December and blood pressure was 150/100. Pt said was elevated before but not that high. She has been having occasional headaches for the past 6 months. Has had light sensitivity for years. Pt denies dizziness, blurred vision, chest pain, SOB or lower leg edema. Denies excessive caffeine intake, stimulant usage, excessive alcohol intake. Pt does eat a lot of fast food, salt consumption. She drinks occasional gingerale and 100% juice. One cup of coffee daily.  BP Readings from Last 3 Encounters:  08/11/20 (!) 152/100  05/16/16 132/88  10/03/14 124/80   Last formal exam within the past 3 months.  Health Maintenance Due  Topic Date Due  . Hepatitis C Screening  Never done  . HIV Screening  Never done  . PAP SMEAR-Modifier  10/02/2017  . COVID-19 Vaccine (3 - Booster for [employer] series) 08/06/2020    Past Medical History:  Diagnosis Date  . Allergy   . Anemia   . Chicken pox   . 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 Sickle cell trait (HCC)      Social History   Tobacco Use  . Smoking status: Never Smoker  . Smokeless tobacco: Never Used  Vaping Use  . Vaping Use: Never used  Substance Use Topics  . Alcohol use: Yes    Alcohol/week: 3.0 standard drinks    Types: 1 Glasses of wine, 2 Shots of liquor per week    Comment: Occasional. Not weekly  . Drug use: Never    Past Surgical History:  Procedure Laterality Date  . HERNIA REPAIR  1988    Family History  Problem Relation Age of Onset  . Diabetes Mother   . Hypertension Mother   . Alcohol abuse Father   . Hypertension Father   . Sickle cell anemia Brother   . Stroke Brother        x 3    . Cancer Maternal Aunt        breast CA, 2 aunts  . Diabetes Maternal Aunt   . Colon cancer Maternal Grandmother 41  . Dementia Paternal Grandmother     PMHx, SurgHx, SocialHx, FamHx, Medications, and Allergies were reviewed in the Visit Navigator and updated as appropriate.   Patient Active Problem List   Diagnosis Date Noted  . Perennial allergic rhinitis 07/05/2013  . Obesity 10/16/2011  . Sickle cell trait ([place]) 04/18/2011  . Anemia 04/18/2011  . 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 Smoking status: Never Smoker  . Smokeless tobacco: Never Used  Vaping Use  . Vaping Use: Never used  Substance Use Topics  . Alcohol use: Yes    Alcohol/week: 3.0 standard drinks    Types: 1 Glasses of wine, 2 Shots of liquor per week    Comment: Occasional. Not weekly  . Drug use: Never    Current Medications and Allergies:    Current Outpatient Medications:  Marland Kitchen  Multiple Vitamins-Minerals (B COMPLEX-C-E-ZINC) tablet, Take 1 tablet by mouth daily., Disp: , Rfl:  .  ondansetron (ZOFRAN) 4 MG tablet, Take 1 tablet (4 mg total) by mouth  every 6 (six) hours as needed for nausea or vomiting., Disp: 12 tablet, Rfl: 0  No Known Allergies  Review of Systems   ROS  Negative unless otherwise specified per HPI.  Vitals:   Vitals:   08/11/20 1115  BP: (!) 152/100  Pulse: 98  Temp: 97.7 F (36.5 C)  TempSrc: Temporal  SpO2: 96%  Weight: (!) 321 lb (145.6 kg)  Height: 5\' 9"  (1.753 m)     Body mass index is 47.4 kg/m.   Physical Exam:    Physical Exam Vitals and nursing note reviewed.  Constitutional:      General: She is not in acute distress.    Appearance: She is well-developed. She is not ill-appearing, toxic-appearing or sickly-appearing.  Cardiovascular:     Rate and Rhythm: Normal rate and regular rhythm.     Pulses: Normal pulses.     Heart sounds: Normal heart sounds, S1 normal and S2 normal.     Comments: No LE edema Pulmonary:      Effort: Pulmonary effort is normal.     Breath sounds: Normal breath sounds.  Skin:    General: Skin is warm, dry and intact.  Neurological:     Mental Status: She is alert.     GCS: GCS eye subscore is 4. GCS verbal subscore is 5. GCS motor subscore is 6.  Psychiatric:        Mood and Affect: Mood and affect normal.        Speech: Speech normal.        Behavior: Behavior normal. Behavior is cooperative.      Assessment and Plan:    Tara Fox was seen today for establish care and c/o elevated blood pressure.  Diagnoses and all orders for this visit:  Elevated blood pressure reading -     CBC with Differential/Platelet -     Comprehensive metabolic panel -     Lipid panel -     TSH   Above goal today. Asymptomatic. This is my first elevated reading on her. I have given her BP log to keep track of BP. If numbers remain consistently >130/80, then will likely start valsartan-hctz combo. Update blood work today. Advised as follows: --Work on bringing meals from home 1-2 times per week during work (with goal of ordering out less while at work.) -- Please send me a message in the next 2-4 weeks (or make an office visit) to see how your blood pressure has been doing. My goal is <130/80.  CMA or LPN served as scribe during this visit. History, Physical, and Plan performed by medical provider. The above documentation has been reviewed and is accurate and complete.  Inda Coke, PA-C [place] [place] 08/11/2020  Follow-up: No follow-ups on file.

## 2020-08-30 ENCOUNTER — Telehealth: Payer: No Typology Code available for payment source | Admitting: Family

## 2020-08-30 ENCOUNTER — Other Ambulatory Visit: Payer: Self-pay | Admitting: Family

## 2020-08-30 DIAGNOSIS — J069 Acute upper respiratory infection, unspecified: Secondary | ICD-10-CM

## 2020-08-30 MED ORDER — CETIRIZINE HCL 10 MG PO TABS: 10.0000 mg | ORAL_TABLET | Freq: Every day | ORAL | 11 refills | Status: DC

## 2020-08-30 MED ORDER — FLUTICASONE PROPIONATE 50 MCG/ACT NA SUSP: 2.0000 | Freq: Every day | NASAL | 6 refills | Status: DC

## 2020-08-30 NOTE — Progress Notes (Signed)
We are sorry you are not feeling well.  Here is how we plan to help!  Based on what you have shared with me, it looks like you may have a viral upper respiratory infection.  Upper respiratory infections are caused by a large number of viruses; however, rhinovirus is the most common cause.   Symptoms vary from person to person, with common symptoms including sore throat, cough, fatigue or lack of energy and feeling of general discomfort.  A low-grade fever of up to 100.4 may present, but is often uncommon.  Symptoms vary however, and are closely related to a person's age or underlying illnesses.  The most common symptoms associated with an upper respiratory infection are nasal discharge or congestion, cough, sneezing, headache and pressure in the ears and face.  These symptoms usually persist for about 3 to 10 days, but can last up to 2 weeks.  It is important to know that upper respiratory infections do not cause serious illness or complications in most cases.    Upper respiratory infections can be transmitted from person to person, with the most common method of transmission being a person's hands.  The virus is able to live on the skin and can infect other persons for up to 2 hours after direct contact.  Also, these can be transmitted when someone coughs or sneezes; thus, it is important to cover the mouth to reduce this risk.  To keep the spread of the illness at [place], good hand hygiene is very important.  This is an infection that is most likely caused by a virus. There are no specific treatments other than to help you with the symptoms until the infection runs its course.  We are sorry you are not feeling well.  Here is how we plan to help!   For nasal congestion, you may use an oral decongestants such as Mucinex D or if you have glaucoma or high blood pressure use plain Mucinex.  Saline nasal spray or nasal drops can help and can safely be used as often as needed for congestion.  For your congestion,  I have prescribed Fluticasone nasal spray one spray in each nostril twice a day and daily zyrtec 10 mg.   If you do not have a history of heart disease, hypertension, diabetes or thyroid disease, prostate/bladder issues or glaucoma, you may also use Sudafed to treat nasal congestion.  It is highly recommended that you consult with a pharmacist or your primary care physician to ensure this medication is safe for you to take.     If you have a cough, you may use cough suppressants such as Delsym and Robitussin.  If you have glaucoma or high blood pressure, you can also use Coricidin HBP.     If you have a sore or scratchy throat, use a saltwater gargle-  to  teaspoon of salt dissolved in a 4-ounce to 8-ounce glass of warm water.  Gargle the solution for approximately 15-30 seconds and then spit.  It is important not to swallow the solution.  You can also use throat lozenges/cough drops and Chloraseptic spray to help with throat pain or discomfort.  Warm or cold liquids can also be helpful in relieving throat pain.  For headache, pain or general discomfort, you can use Ibuprofen or Tylenol as directed.   Some authorities believe that zinc sprays or the use of Echinacea may shorten the course of your symptoms.   HOME CARE . Only take medications as instructed by your medical  team. . Be sure to drink plenty of fluids. Water is fine as well as fruit juices, sodas and electrolyte beverages. You may want to stay away from caffeine or alcohol. If you are nauseated, try taking small sips of liquids. How do you know if you are getting enough fluid? Your urine should be a pale yellow or almost colorless. . Get rest. . Taking a steamy shower or using a humidifier may help nasal congestion and ease sore throat pain. You can place a towel over your head and breathe in the steam from hot water coming from a faucet. . Using a saline nasal spray works much the same way. . Cough drops, hard candies and sore throat  lozenges may ease your cough. . Avoid close contacts especially the very young and the elderly . Cover your mouth if you cough or sneeze . Always remember to wash your hands.   GET HELP RIGHT AWAY IF: . You develop worsening fever. . If your symptoms do not improve within 10 days . You develop yellow or green discharge from your nose over 3 days. . You have coughing fits . You develop a severe head ache or visual changes. . You develop shortness of breath, difficulty breathing or start having chest pain . Your symptoms persist after you have completed your treatment plan  MAKE SURE YOU   Understand these instructions.  Will watch your condition.  Will get help right away if you are not doing well or get worse.  Your e-visit answers were reviewed by a board certified advanced clinical practitioner to complete your personal care plan. Depending upon the condition, your plan could have included both over the counter or prescription medications. Please review your pharmacy choice. If there is a problem, you may call our nursing hot line at and have the prescription routed to another pharmacy. Your safety is important to [place]. If you have drug allergies check your prescription carefully.   You can use MyChart to ask questions about today's visit, request a non-urgent call back, or ask for a work or school excuse for 24 hours related to this e-Visit. If it has been greater than 24 hours you will need to follow up with your provider, or enter a new e-Visit to address those concerns. You will get an e-mail in the next two days asking about your experience.  I hope that your e-visit has been valuable and will speed your recovery. Thank you for using e-visits.  Approximately 5 minutes was spent documenting and reviewing patient's chart.

## 2020-08-31 ENCOUNTER — Telehealth: Payer: No Typology Code available for payment source | Admitting: Family Medicine

## 2021-01-31 DIAGNOSIS — Z20822 Contact with and (suspected) exposure to covid-19: Secondary | ICD-10-CM | POA: Diagnosis not present

## 2021-02-02 ENCOUNTER — Encounter: Payer: Self-pay | Admitting: Physician Assistant

## 2021-02-02 ENCOUNTER — Ambulatory Visit: Payer: BC Managed Care – PPO | Admitting: Physician Assistant

## 2021-02-02 ENCOUNTER — Other Ambulatory Visit: Payer: Self-pay

## 2021-02-02 VITALS — HR 100

## 2021-02-02 DIAGNOSIS — R5383 Other fatigue: Secondary | ICD-10-CM

## 2021-02-02 DIAGNOSIS — R6883 Chills (without fever): Secondary | ICD-10-CM | POA: Diagnosis not present

## 2021-02-02 DIAGNOSIS — R519 Headache, unspecified: Secondary | ICD-10-CM | POA: Diagnosis not present

## 2021-02-02 LAB — POCT INFLUENZA A/B
Influenza A, POC: NEGATIVE
Influenza B, POC: NEGATIVE

## 2021-02-02 MED ORDER — AZITHROMYCIN 250 MG PO TABS: ORAL_TABLET | ORAL | 0 refills | Status: AC

## 2021-02-02 NOTE — Progress Notes (Signed)
Tara Fox is a 30 y.o. female here for a new problem.  History of Present Illness:   Chief Complaint  Patient presents with   Facial Pain   Headache   Chills    With sweats    Fatigue   COVID TEST     Took a PCR test Wednesday and results was negative Yesterday    Nausea    HPI  Patient is requesting evaluation for possible COVID-19.  Symptom onset: last wednesday  Travel/contacts: occasional customers, and gym   Vaccination status: has received both covid vaccines  Testing results: Patient reports negative PCR test yesterday  Patient endorses the following symptoms: Ongoing fatigue, HA, sweats/chills She reports that she has not had a true fever  Patient denies the following symptoms: SOB, chest pain, leg swelling, sore throat, ear pain   Eating and drinking okay No hx of asthma  She denies any concerns for pregnancy   She also reports that she has had COVID before and it started to very similar to this.  She is wondering if maybe she had COVID last week.  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 Not weekly   Drug use: Never    Past Surgical History:  Procedure Laterality Date   HERNIA REPAIR  1988    Family History  Problem Relation Age of Onset   Diabetes Mother    Hypertension Mother    Alcohol abuse Father    Hypertension Father    Sickle cell anemia Brother    Stroke Brother        x 3    Cancer Maternal Aunt        breast CA, 2 aunts   Diabetes Maternal Aunt    Colon cancer Maternal Grandmother 64   Dementia Paternal Grandmother     No Known Allergies  Current  Medications:   Current Outpatient Medications:    azithromycin (ZITHROMAX) 250 MG tablet, Take 2 tablets on day 1, then 1 tablet daily on days 2 through 5, Disp: 6 tablet, Rfl: 0   cetirizine (ZYRTEC) 10 MG tablet, TAKE 1 TABLET BY MOUTH ONCE A DAY, Disp: 30 tablet, Rfl: 11   Ferrous Sulfate (IRON PO), Take by mouth daily., Disp: , Rfl:    fluticasone (FLONASE) 50 MCG/ACT nasal spray, USE 2 SPRAYS IN EACH NOSTRIL ONCE A DAY (Patient taking differently: Place 2 sprays into both nostrils as needed.), Disp: 16 g, Rfl: 6   Multiple Vitamins-Minerals (B COMPLEX-C-E-ZINC) tablet, Take 1 tablet by mouth daily., Disp: , Rfl:    ondansetron (ZOFRAN) 4 MG tablet, Take 1 tablet (4 mg total) by mouth every 6 (six) hours as needed for nausea or vomiting. (Patient not taking: Reported on 02/02/2021), Disp: 12 tablet, Rfl: 0   Review of Systems:   ROS Negative unless otherwise specified per HPI.  Vitals:   Vitals:   02/02/21 1058  Pulse: 100  SpO2: 97%     There is no height or weight on  file to calculate BMI.  Physical Exam:   Physical Exam Vitals and nursing note reviewed.  Constitutional:      General: She is not in acute distress.    Appearance: She is well-developed. She is not ill-appearing or toxic-appearing.  Cardiovascular:     Rate and Rhythm: Regular rhythm. Tachycardia present.     Pulses: Normal pulses.     Heart sounds: Normal heart sounds, S1 normal and S2 normal.     Comments: No LE edema Pulmonary:     Effort: Pulmonary effort is normal.     Breath sounds: Normal breath sounds.  Skin:    General: Skin is warm and dry.  Neurological:     Mental Status: She is alert.     GCS: GCS eye subscore is 4. GCS verbal subscore is 5. GCS motor subscore is 6.  Psychiatric:        Speech: Speech normal.        Behavior: Behavior normal. Behavior is cooperative.    Results for orders placed or performed in visit on 02/02/21  POCT Influenza A/B  Result Value Ref Range   Influenza  A, POC Negative Negative   Influenza B, POC Negative Negative    Assessment and Plan:   Tara Fox was seen today for facial pain, headache, chills, fatigue, covid test  and nausea.  Diagnoses and all orders for this visit:  Nonintractable headache, unspecified chronicity pattern, unspecified headache type; Fatigue, unspecified type; Chills Flu test is negative Recommend we start Z-Pak to cover for early bacterial infection given chronicity of symptoms Worsening precautions advised I also recommended that she push fluids in setting of very slight tachycardia on exam, she endorses that she has not had anything to drink today No red flags on exam or history Return to work note provided If feeling any worse over the weekend needs to go to urgent care or ER -     POCT Influenza A/B  Other orders -     azithromycin (ZITHROMAX) 250 MG tablet; Take 2 tablets on day 1, then 1 tablet daily on days 2 through [place], PA-C

## 2021-02-02 NOTE — Patient Instructions (Signed)
It was great to see you!  I will be in touch with your flu test results  Go ahead and start taking the zpack antibiotic to cover for upper respiratory infection  Push fluids and get plenty of rest. Please return if you are not improving as expected, or if you have high fevers (>101.5) or difficulty swallowing or worsening productive cough.  Call clinic with questions.  I hope you start feeling better soon!

## 2021-02-06 ENCOUNTER — Encounter: Payer: Self-pay | Admitting: Physician Assistant

## 2021-02-06 ENCOUNTER — Telehealth: Payer: Self-pay

## 2021-02-06 NOTE — Telephone Encounter (Signed)
Pt called back told her work note has been updated and she can access it through her My Chart. Pt verbalized understanding and said need to correct return to work Tuesday not Monday. Told her okay I will fix it now. Pt verbalized understanding. Note fixed.

## 2021-02-06 NOTE — Telephone Encounter (Signed)
Left message on voicemail to call office.  

## 2021-02-06 NOTE — Telephone Encounter (Signed)
Aldona Bar, okay to change date on return to work note?

## 2021-02-06 NOTE — Telephone Encounter (Signed)
Pt called in stating that she needs Sam to write a new work note for her. She stated that she saw Sam on Friday 8/19 and she wrote her a work note. Pt now stated that she went back to work today so she needs a work note excusing her up until today.

## 2021-07-09 ENCOUNTER — Other Ambulatory Visit: Payer: Self-pay

## 2021-07-09 ENCOUNTER — Ambulatory Visit (HOSPITAL_BASED_OUTPATIENT_CLINIC_OR_DEPARTMENT_OTHER)
Admission: RE | Admit: 2021-07-09 | Discharge: 2021-07-09 | Disposition: A | Discharge status: Home / Self Care | Payer: No Typology Code available for payment source | Source: Ambulatory Visit | Attending: Physician Assistant | Admitting: Physician Assistant

## 2021-07-09 ENCOUNTER — Encounter: Payer: Self-pay | Admitting: Physician Assistant

## 2021-07-09 ENCOUNTER — Ambulatory Visit (INDEPENDENT_AMBULATORY_CARE_PROVIDER_SITE_OTHER): Payer: No Typology Code available for payment source | Admitting: Physician Assistant

## 2021-07-09 VITALS — BP 130/98 | HR 118 | Temp 97.7°F | Ht 69.0 in | Wt 327.4 lb

## 2021-07-09 DIAGNOSIS — F410 Panic disorder [episodic paroxysmal anxiety] without agoraphobia: Secondary | ICD-10-CM

## 2021-07-09 DIAGNOSIS — R030 Elevated blood-pressure reading, without diagnosis of hypertension: Secondary | ICD-10-CM

## 2021-07-09 DIAGNOSIS — Z114 Encounter for screening for human immunodeficiency virus [HIV]: Secondary | ICD-10-CM

## 2021-07-09 DIAGNOSIS — D649 Anemia, unspecified: Secondary | ICD-10-CM

## 2021-07-09 DIAGNOSIS — Z136 Encounter for screening for cardiovascular disorders: Secondary | ICD-10-CM | POA: Diagnosis not present

## 2021-07-09 DIAGNOSIS — Z0001 Encounter for general adult medical examination with abnormal findings: Secondary | ICD-10-CM

## 2021-07-09 DIAGNOSIS — Z1159 Encounter for screening for other viral diseases: Secondary | ICD-10-CM

## 2021-07-09 DIAGNOSIS — E669 Obesity, unspecified: Secondary | ICD-10-CM

## 2021-07-09 DIAGNOSIS — R1907 Generalized intra-abdominal and pelvic swelling, mass and lump: Secondary | ICD-10-CM

## 2021-07-09 DIAGNOSIS — Z13220 Encounter for screening for lipoid disorders: Secondary | ICD-10-CM

## 2021-07-09 LAB — HIV ANTIBODY (ROUTINE TESTING W REFLEX): HIV 1&2 Ab, 4th Generation: NONREACTIVE

## 2021-07-09 LAB — CBC WITH DIFFERENTIAL/PLATELET
Basophils Absolute: 0 10*3/uL (ref 0.0–0.1)
Basophils Relative: 0.6 % (ref 0.0–3.0)
Eosinophils Absolute: 0.1 10*3/uL (ref 0.0–0.7)
Eosinophils Relative: 0.9 % (ref 0.0–5.0)
HCT: 38.5 % (ref 36.0–46.0)
Hemoglobin: 12.1 g/dL (ref 12.0–15.0)
Lymphocytes Relative: 31.3 % (ref 12.0–46.0)
Lymphs Abs: 1.9 10*3/uL (ref 0.7–4.0)
MCHC: 31.4 g/dL (ref 30.0–36.0)
MCV: 75.8 fl — ABNORMAL LOW (ref 78.0–100.0)
Monocytes Absolute: 0.3 10*3/uL (ref 0.1–1.0)
Monocytes Relative: 5.7 % (ref 3.0–12.0)
Neutro Abs: 3.7 10*3/uL (ref 1.4–7.7)
Neutrophils Relative %: 61.5 % (ref 43.0–77.0)
Platelets: 253 10*3/uL (ref 150.0–400.0)
RBC: 5.08 Mil/uL (ref 3.87–5.11)
RDW: 18.6 % — ABNORMAL HIGH (ref 11.5–15.5)
WBC: 6 10*3/uL (ref 4.0–10.5)

## 2021-07-09 LAB — HEPATITIS C ANTIBODY
Hepatitis C Ab: NONREACTIVE
SIGNAL TO CUT-OFF: 0.06 (ref <1.00)

## 2021-07-09 LAB — COMPREHENSIVE METABOLIC PANEL
ALT: 11 U/L (ref 0–35)
AST: 16 U/L (ref 0–37)
Albumin: 3.8 g/dL (ref 3.5–5.2)
Alkaline Phosphatase: 50 U/L (ref 39–117)
BUN: 9 mg/dL (ref 6–23)
CO2: 24 mEq/L (ref 19–32)
Calcium: 9.1 mg/dL (ref 8.4–10.5)
Chloride: 108 mEq/L (ref 96–112)
Creatinine, Ser: 1.1 mg/dL (ref 0.40–1.20)
GFR: 66.47 mL/min (ref >60.00)
Glucose, Bld: 92 mg/dL (ref 70–99)
Potassium: 3.9 mEq/L (ref 3.5–5.1)
Sodium: 140 mEq/L (ref 135–145)
Total Bilirubin: 0.4 mg/dL (ref 0.2–1.2)
Total Protein: 7.4 g/dL (ref 6.0–8.3)

## 2021-07-09 LAB — LIPID PANEL
Cholesterol: 123 mg/dL (ref 0–200)
HDL: 41.3 mg/dL (ref >39.00)
LDL Cholesterol: 63 mg/dL (ref 0–99)
NonHDL: 81.7
Total CHOL/HDL Ratio: 3
Triglycerides: 92 mg/dL (ref 0.0–149.0)
VLDL: 18.4 mg/dL (ref 0.0–40.0)

## 2021-07-09 LAB — POCT URINE PREGNANCY: Preg Test, Ur: NEGATIVE

## 2021-07-09 MED ORDER — BUSPIRONE HCL 5 MG PO TABS: 5.0000 mg | ORAL_TABLET | Freq: Three times a day (TID) | ORAL | 1 refills | Status: DC | PRN

## 2021-07-09 NOTE — Patient Instructions (Addendum)
It was great to see you!  I have sent in buspar 5 mg for you to take up to 3 times daily as needed  Please keep an eye on your blood pressure and let us know if consistently >150/90 or if you develop symptoms  We are going to obtain an ultrasound to examine your abdomen         Please go to the lab for blood work.   Our office will call you with your results unless you have chosen to receive results via MyChart.  If your blood work is normal we will follow-up each year for physicals and as scheduled for chronic medical problems.  If anything is abnormal we will treat accordingly and get you in for a follow-up.  Take care,  Aldona Bar

## 2021-07-09 NOTE — Progress Notes (Signed)
Subjective:    Tara Fox is a 31 y.o. female and is here for a comprehensive physical exam.  HPI  Health Maintenance Due  Topic Date Due   HIV Screening  Never done   Hepatitis C Screening  Never done   COVID-19 Vaccine (3 - Booster for Pfizer series) 03/31/2020   Acute Concerns: Elevated Blood Pressure Reading Pt has had multiple elevated blood pressure readings in the past, ranging from 132/88 to 130/98. She did have a higher BP reading of 150/100 during a dentist appointment in December of 2021, but this has not been recurrent. At home blood pressure readings are: not checked. She is not sure if anxiety could be linked to her elevated readings. At this time she is interested in continuing to improve her lifestyle choices rather than starting medication. Patient denies chest pain, SOB, blurred vision, dizziness, unusual headaches, lower leg swelling. Denies excessive caffeine intake, stimulant usage, excessive alcohol intake, or increase in salt consumption.  BP Readings from Last 3 Encounters:  07/09/21 (!) 130/98  08/11/20 (!) 130/98  05/16/16 132/88   Anxiety Tara Fox has been experiencing an increase in panic attacks, usually having these during the day but recently transitioning into having these at night as well. States these vary from a few minutes to a few hours to recover from. Currently participating in talk therapy once every 3 weeks and finds this beneficial. 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 Pt has recently finished school, but is still looking for a job in her desired field.   Additionally she is set to be evaluated for adult ADD/ADHD, and she believes this to be another trigger for panic attacks. At this time she is interested in taking a medication as needed. Denies SI/HI.   Abdominal abnormality On my exam today she was found to have areas of abdominal firmness. She states that she  has noticed this for a few months. Denies: abdominal pain, n/v, constipation, changes to periods, concerns for pregnancy. Last visit to gynecology within the past year per patient report.  Chronic Issues: Hx of Iron Deficiency  Pt has a hx of this due to heavy menstrual cycles. She has been compliant in the past with taking an OTC iron supplement daily with no complications. She has not taken this supplement for about a month, due to running out but is working to replenish her stock soon. Denies any ongoing sx.    Health Maintenance: Immunizations -- Covid- UTD per patient report Influenza- UTD Tdap-UTD;2020 PAP -- UTD per patient report Dentistry- UTD Ophthalmology- UTD Bone Density -- N/A Diet -- Eats all food groups  Caffeine Intake- 1 cup of coffee daily  Sleep habits -- Usually normal despite occasional panic attacks Exercise -- Stationary bike 3x week  Current Weight -- Stable  Weight History: Wt Readings from Last 10 Encounters:  07/09/21 (!) 327 lb 6.1 oz (148.5 kg)  08/11/20 (!) 321 lb (145.6 kg)  05/16/16 (!) 325 lb (147.4 kg)  10/03/14 (!) 313 lb (142 kg)  12/09/13 (!) 314 lb (142.4 kg)  07/05/13 (!) 321 lb (145.6 kg)  10/16/11 (!) 307 lb (139.3 kg)  10/03/11 (!) 307 lb (139.3 kg)  04/18/11 (!) 310 lb (140.6 kg)   Body mass index is 48.35 kg/m. Mood -- Stable; ongoing anxiety  Patient's last menstrual period was 06/15/2021 (approximate). Period characteristics -- Normal; heavy  Birth control -- None    reports current  alcohol use of about 3.0 standard drinks per week.  Tobacco Use: Low Risk    Smoking Tobacco Use: Never   Smokeless Tobacco Use: Never   Passive Exposure: Not on file     Depression screen [facility] 2/9 07/09/2021  Decreased Interest 0  Down, Depressed, Hopeless 0  PHQ - 2 Score 0     Other providers/specialists: Patient Care Team: Inda Coke, [place] as PCP - General (Physician Assistant)   PMHx, SurgHx, SocialHx, Medications, and  Allergies were reviewed in the Visit Navigator and updated as appropriate.   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   Sickle cell trait ([place])      Past Surgical History:  Procedure Laterality Date   HERNIA REPAIR  1988     Family History  Problem Relation Age of Onset   Diabetes Mother    Hypertension Mother    Alcohol abuse Father    Hypertension Father    Sickle cell anemia Brother    Stroke Brother        x 3    Cancer Maternal Aunt        breast CA, 2 aunts   Diabetes Maternal Aunt    Colon cancer Maternal Grandmother 66   Dementia Paternal Grandmother     Social History   Tobacco Use   Smoking status: Never   Smokeless tobacco: Never  Vaping Use   Vaping Use: Never used  Substance Use Topics   Alcohol use: Yes    Alcohol/week: 3.0 standard drinks    Types: 1 Glasses of wine, 2 Shots of liquor per week    Comment: Occasional. Not weekly   Drug use: Never    Review of Systems:   Review of Systems  Constitutional:  Negative for chills, fever, malaise/fatigue and weight loss.  HENT:  Negative for hearing loss, sinus pain and sore throat.   Respiratory:  Negative for cough and hemoptysis.   Cardiovascular:  Negative for chest pain, palpitations, leg swelling and PND.  Gastrointestinal:  Negative for abdominal pain, constipation, diarrhea, heartburn, nausea and vomiting.  Genitourinary:  Negative for dysuria, frequency and urgency.  Musculoskeletal:  Negative for back pain, myalgias and neck pain.  Skin:  Negative for itching and rash.  Neurological:  Negative for dizziness, tingling, seizures and headaches.  Endo/Heme/Allergies:  Negative for polydipsia.  Psychiatric/Behavioral:  Negative for depression. The patient is not nervous/anxious.    Objective:   BP (!) 130/98 (BP Location: Left Arm, Patient Position: Sitting, Cuff Size: Large)    Pulse (!) 118    Temp 97.7 F (36.5 C) (Temporal)     Ht 5\' 9"  (1.753 m)    Wt (!) 327 lb 6.1 oz (148.5 kg)    LMP 06/15/2021 (Approximate)    SpO2 96%    BMI 48.35 kg/m   General Appearance:    Alert, cooperative, no distress, appears stated age  Head:    Normocephalic, without obvious abnormality, atraumatic  Eyes:    PERRL, conjunctiva/corneas clear, EOM's intact, fundi    benign, both eyes  Ears:    Normal TM's and external ear canals, both ears  Nose:   Nares normal, septum midline, mucosa normal, no drainage    or sinus tenderness  Throat:   Lips, mucosa, and tongue normal; teeth and gums normal  Neck:   Supple, symmetrical, trachea midline, no adenopathy;    thyroid:  no enlargement/tenderness/nodules; no carotid   bruit or JVD  Back:     Symmetric, no curvature, ROM normal, no CVA tenderness  Lungs:     Clear to auscultation bilaterally, respirations unlabored  Chest Wall:    No tenderness or deformity   Heart:    Regular rate and rhythm, S1 and S2 normal, no murmur, rub   or gallop  Breast Exam:    Deferred  Abdomen:     Lower and left-sided abdominal firmness present with significant TTP, non-tender, bowel sounds active all four quadrants,    no masses, no organomegaly  Genitalia:    Deferred  Rectal:    Deferred  Extremities:   Extremities normal, atraumatic, no cyanosis or edema  Pulses:   2+ and symmetric all extremities  Skin:   Skin color, texture, turgor normal, no rashes or lesions  Lymph nodes:   Cervical, supraclavicular, and axillary nodes normal  Neurologic:   CNII-XII intact, normal strength, sensation and reflexes    throughout    Assessment/Plan:   Encounter for annual general medical examination with abnormal findings in adult Today patient counseled on age appropriate routine health concerns for screening and prevention, each reviewed and up to date or declined. Immunizations reviewed and up to date or declined. Labs ordered and reviewed. Risk factors for depression reviewed and negative. Hearing function and  visual acuity are intact. ADLs screened and addressed as needed. Functional ability and level of safety reviewed and appropriate. Education, counseling and referrals performed based on assessed risks today. Patient provided with a copy of personalized plan for preventive services.  Panic attacks Uncontrolled Very likely related to uncontrolled ADHD -- she is going to be evaluated for this soon. I did discuss with her that I would be happy to take over management if needed after dx. No red flags on discussion; denies SI/HI Start buspar 5 mg three times daily as needed Follow up as needed  Elevated blood pressure reading Above goal today Will not tart medication at this time Monitor BP at home regularly, 1-2 times a week, keep a log of readings I advised patient that if BP readings are consistently >150/90, to reach out to office and medication will be started  Generalized abdominal mass POCT Urine pregnancy negative Order US Abdomen complete for further evaluation Follow up based on results   Obesity Continue healthy diet and exercise as able.  Screening for HIV (human immunodeficiency virus)  - HIV Antibody  Encounter for screening for other viral diseases  - Hepatitis C Antibody  Encounter for lipid screening for cardiovascular disease Update lipid panel today, will make recommendations accordingly     Patient Counseling:   [x]     Nutrition: Stressed importance of moderation in sodium/caffeine intake, saturated fat and cholesterol, caloric balance, sufficient intake of fresh fruits, vegetables, fiber, calcium, iron, and 1 mg of folate supplement per day (for females capable of pregnancy).   [x]      Stressed the importance of regular exercise.    [x]     Substance Abuse: Discussed cessation/primary prevention of tobacco, alcohol, or other drug use; driving or other dangerous activities under the influence; availability of treatment for abuse.    [x]      Injury  prevention: Discussed safety belts, safety helmets, smoke detector, smoking near bedding or upholstery.    [x]      Sexuality: Discussed sexually transmitted diseases, partner selection, use of condoms, avoidance of unintended pregnancy  and contraceptive alternatives.    [x]   Dental health: Discussed importance of regular tooth brushing, flossing, and dental visits.   [x]      Health maintenance and immunizations reviewed. Please refer to Health maintenance section.   I,Havlyn C Ratchford,acting as a Education administrator for [employer], PA.,have documented all relevant documentation on the behalf of Inda Coke, PA,as directed by  Inda Coke, PA while in the presence of Inda Coke, [place].  I, Inda Coke, [place], have reviewed all documentation for this visit. The documentation on 07/09/21 for the exam, diagnosis, procedures, and orders are all accurate and complete.   Inda Coke, PA-C [place]

## 2021-07-10 ENCOUNTER — Other Ambulatory Visit: Payer: Self-pay | Admitting: Physician Assistant

## 2021-07-10 ENCOUNTER — Ambulatory Visit (HOSPITAL_COMMUNITY)
Admission: RE | Admit: 2021-07-10 | Discharge: 2021-07-10 | Disposition: A | Discharge status: Home / Self Care | Payer: No Typology Code available for payment source | Source: Ambulatory Visit | Attending: Physician Assistant | Admitting: Physician Assistant

## 2021-07-10 DIAGNOSIS — K769 Liver disease, unspecified: Secondary | ICD-10-CM | POA: Diagnosis present

## 2021-07-10 DIAGNOSIS — R160 Hepatomegaly, not elsewhere classified: Secondary | ICD-10-CM

## 2021-07-10 DIAGNOSIS — N852 Hypertrophy of uterus: Secondary | ICD-10-CM | POA: Diagnosis present

## 2021-07-10 DIAGNOSIS — N2889 Other specified disorders of kidney and ureter: Secondary | ICD-10-CM

## 2021-07-10 MED ORDER — GADOBUTROL 1 MMOL/ML IV SOLN
10.0000 mL | Freq: Once | INTRAVENOUS | Status: AC | PRN
  Administered 2021-07-10: 19:00:00 10 mL via INTRAVENOUS

## 2021-07-12 ENCOUNTER — Encounter: Payer: Self-pay | Admitting: Physician Assistant

## 2021-07-17 ENCOUNTER — Telehealth: Payer: Self-pay | Admitting: Physician Assistant

## 2021-07-17 NOTE — Telephone Encounter (Signed)
Spoke to pt told her imaging results were faxed to [facility] as requested. Pt verbalized understanding.

## 2021-07-17 NOTE — Telephone Encounter (Signed)
Pt states she is needing her imaging results from 07/10/21 to be sent to her gynecologist.  [facility] Dr Alwyn Pea Fax: [phone] Attn: Kristeen Miss

## 2021-08-06 ENCOUNTER — Encounter: Payer: Self-pay | Admitting: Physician Assistant

## 2021-09-21 ENCOUNTER — Encounter: Payer: Self-pay | Admitting: Physician Assistant

## 2021-09-24 NOTE — Telephone Encounter (Signed)
Please advise where to send referral? 

## 2021-09-24 NOTE — Telephone Encounter (Signed)
[facility] referral form completed, along with office notes, labs and imaging faxed to [phone]. ?

## 2021-10-12 NOTE — Telephone Encounter (Signed)
Received fax from [place], pt is scheduled to be seen 11/09/2021 at 11:00 AM. ?

## 2021-11-09 DIAGNOSIS — I10 Essential (primary) hypertension: Secondary | ICD-10-CM

## 2021-11-09 DIAGNOSIS — D1803 Hemangioma of intra-abdominal structures: Secondary | ICD-10-CM | POA: Insufficient documentation

## 2021-11-09 HISTORY — DX: Essential (primary) hypertension: I10

## 2021-11-13 ENCOUNTER — Other Ambulatory Visit: Payer: Self-pay | Admitting: Nurse Practitioner

## 2021-11-13 DIAGNOSIS — D1803 Hemangioma of intra-abdominal structures: Secondary | ICD-10-CM

## 2021-11-14 ENCOUNTER — Ambulatory Visit
Admission: RE | Admit: 2021-11-14 | Discharge: 2021-11-14 | Disposition: A | Discharge status: Home / Self Care | Payer: No Typology Code available for payment source | Source: Ambulatory Visit | Attending: Nurse Practitioner | Admitting: Nurse Practitioner

## 2021-11-14 DIAGNOSIS — D1803 Hemangioma of intra-abdominal structures: Secondary | ICD-10-CM

## 2021-11-14 MED ORDER — GADOBENATE DIMEGLUMINE 529 MG/ML IV SOLN
20.0000 mL | Freq: Once | INTRAVENOUS | Status: AC | PRN
  Administered 2021-11-14: 20 mL via INTRAVENOUS

## 2021-11-17 ENCOUNTER — Other Ambulatory Visit: Payer: No Typology Code available for payment source

## 2022-01-02 ENCOUNTER — Telehealth: Payer: No Typology Code available for payment source | Admitting: Physician Assistant

## 2022-01-02 DIAGNOSIS — B9689 Other specified bacterial agents as the cause of diseases classified elsewhere: Secondary | ICD-10-CM

## 2022-01-02 DIAGNOSIS — J019 Acute sinusitis, unspecified: Secondary | ICD-10-CM | POA: Diagnosis not present

## 2022-01-02 MED ORDER — AMOXICILLIN-POT CLAVULANATE 875-125 MG PO TABS: 1.0000 | ORAL_TABLET | Freq: Two times a day (BID) | ORAL | 0 refills | Status: DC

## 2022-01-02 NOTE — Patient Instructions (Signed)
Tara Fox, thank you for joining Mar Daring, PA-C for today's virtual visit.  While this provider is not your primary care provider (PCP), if your PCP is located in our provider database this encounter information will be shared with them immediately following your visit.  Consent: (Patient) Tara Fox provided verbal consent for this virtual visit at the beginning of the encounter.  Current Medications:  Current Outpatient Medications:    amoxicillin-clavulanate (AUGMENTIN) 875-125 MG tablet, Take 1 tablet by mouth 2 (two) times daily., Disp: 14 tablet, Rfl: 0   busPIRone (BUSPAR) 5 MG tablet, Take 1 tablet (5 mg total) by mouth 3 (three) times daily as needed., Disp: 60 tablet, Rfl: 1   cetirizine (ZYRTEC) 10 MG tablet, TAKE 1 TABLET BY MOUTH ONCE A DAY, Disp: 30 tablet, Rfl: 11   Ferrous Sulfate (IRON PO), Take by mouth daily., Disp: , Rfl:    fluticasone (FLONASE) 50 MCG/ACT nasal spray, USE 2 SPRAYS IN EACH NOSTRIL ONCE A DAY (Patient taking differently: Place 2 sprays into both nostrils as needed.), Disp: 16 g, Rfl: 6   Multiple Vitamins-Minerals (B COMPLEX-C-E-ZINC) tablet, Take 1 tablet by mouth daily., Disp: , Rfl:    Medications ordered in this encounter:  Meds ordered this encounter  Medications   amoxicillin-clavulanate (AUGMENTIN) 875-125 MG tablet    Sig: Take 1 tablet by mouth 2 (two) times daily.    Dispense:  14 tablet    Refill:  0    Order Specific Question:   Supervising Provider    Answer:   Sabra Heck, BRIAN [[id number]]     *If you need refills on other medications prior to your next appointment, please contact your pharmacy*  Follow-Up: Call back or seek an in-person evaluation if the symptoms worsen or if the condition fails to improve as anticipated.  Other Instructions Managing Your Hypertension Hypertension, also called high blood pressure, is when the force of the blood pressing against the walls of the arteries is too strong. Arteries are blood  vessels that carry blood from your heart throughout your body. Hypertension forces the heart to work harder to pump blood and may cause the arteries to become narrow or stiff. Understanding blood pressure readings A blood pressure reading includes a higher number over a lower number: The first, or top, number is called the systolic pressure. It is a measure of the pressure in your arteries as your heart beats. The second, or bottom number, is called the diastolic pressure. It is a measure of the pressure in your arteries as the heart relaxes. For most people, a normal blood pressure is below 120/80. Your personal target blood pressure may vary depending on your medical conditions, your age, and other factors. Blood pressure is classified into four stages. Based on your blood pressure reading, your health care provider may use the following stages to determine what type of treatment you need, if any. Systolic pressure and diastolic pressure are measured in a unit called millimeters of mercury (mmHg). Normal Systolic pressure: below 259. Diastolic pressure: below 80. Elevated Systolic pressure: 563-875. Diastolic pressure: below 80. Hypertension stage 1 Systolic pressure: 643-329. Diastolic pressure: 51-88. Hypertension stage 2 Systolic pressure: 416 or above. Diastolic pressure: 90 or above. How can this condition affect me? Managing your hypertension is very important. Over time, hypertension can damage the arteries and decrease blood flow to parts of the body, including the brain, heart, and kidneys. Having untreated or uncontrolled hypertension can lead to: A heart attack. A stroke. A weakened  blood vessel (aneurysm). Heart failure. Kidney damage. Eye damage. Memory and concentration problems. Vascular dementia. What actions can I take to manage this condition? Hypertension can be managed by making lifestyle changes and possibly by taking medicines. Your health care provider will help  you make a plan to bring your blood pressure within a normal range. You may be referred for counseling on a healthy diet and physical activity. Nutrition  Eat a diet that is high in fiber and potassium, and low in salt (sodium), added sugar, and fat. An example eating plan is called the DASH diet. DASH stands for Dietary Approaches to Stop Hypertension. To eat this way: Eat plenty of fresh fruits and vegetables. Try to fill one-half of your plate at each meal with fruits and vegetables. Eat whole grains, such as whole-wheat pasta, brown rice, or whole-grain bread. Fill about one-fourth of your plate with whole grains. Eat low-fat dairy products. Avoid fatty cuts of meat, processed or cured meats, and poultry with skin. Fill about one-fourth of your plate with lean proteins such as fish, chicken without skin, beans, eggs, and tofu. Avoid pre-made and processed foods. These tend to be higher in sodium, added sugar, and fat. Reduce your daily sodium intake. Many people with hypertension should eat less than 1,500 mg of sodium a day. Lifestyle  Work with your health care provider to maintain a healthy body weight or to lose weight. Ask what an ideal weight is for you. Get at least 30 minutes of exercise that causes your heart to beat faster (aerobic exercise) most days of the week. Activities may include walking, swimming, or biking. Include exercise to strengthen your muscles (resistance exercise), such as weight lifting, as part of your weekly exercise routine. Try to do these types of exercises for 30 minutes at least 3 days a week. Do not use any products that contain nicotine or tobacco. These products include cigarettes, chewing tobacco, and vaping devices, such as e-cigarettes. If you need help quitting, ask your health care provider. Control any long-term (chronic) conditions you have, such as high cholesterol or diabetes. Identify your sources of stress and find ways to manage stress. This may  include meditation, deep breathing, or making time for fun activities. Alcohol use Do not drink alcohol if: Your health care provider tells you not to drink. You are pregnant, may be pregnant, or are planning to become pregnant. If you drink alcohol: Limit how much you have to: 0-1 drink a day for women. 0-2 drinks a day for men. Know how much alcohol is in your drink. In the [place] one drink equals one 12 oz bottle of beer (355 mL), one 5 oz glass of wine (148 mL), or one 1 oz glass of hard liquor (44 mL). Medicines Your health care provider may prescribe medicine if lifestyle changes are not enough to get your blood pressure under control and if: Your systolic blood pressure is 130 or higher. Your diastolic blood pressure is 80 or higher. Take medicines only as told by your health care provider. Follow the directions carefully. Blood pressure medicines must be taken as told by your health care provider. The medicine does not work as well when you skip doses. Skipping doses also puts you at risk for problems. Monitoring Before you monitor your blood pressure: Do not smoke, drink caffeinated beverages, or exercise within 30 minutes before taking a measurement. Use the bathroom and empty your bladder (urinate). Sit quietly for at least 5 minutes before taking measurements. Monitor  your blood pressure at home as told by your health care provider. To do this: Sit with your back straight and supported. Place your feet flat on the floor. Do not cross your legs. Support your arm on a flat surface, such as a table. Make sure your upper arm is at heart level. Each time you measure, take two or three readings one minute apart and record the results. You may also need to have your blood pressure checked regularly by your health care provider. General information Talk with your health care provider about your diet, exercise habits, and other lifestyle factors that may be contributing to  hypertension. Review all the medicines you take with your health care provider because there may be side effects or interactions. Keep all follow-up visits. Your health care provider can help you create and adjust your plan for managing your high blood pressure. Where to find more information National Heart, Lung, and Blood Institute: https://wilson-eaton.com/ American Heart Association: www.heart.org Contact a health care provider if: You think you are having a reaction to medicines you have taken. You have repeated (recurrent) headaches. You feel dizzy. You have swelling in your ankles. You have trouble with your vision. Get help right away if: You develop a severe headache or confusion. You have unusual weakness or numbness, or you feel faint. You have severe pain in your chest or abdomen. You vomit repeatedly. You have trouble breathing. These symptoms may be an emergency. Get help right away. Call 911. Do not wait to see if the symptoms will go away. Do not drive yourself to the hospital. Summary Hypertension is when the force of blood pumping through your arteries is too strong. If this condition is not controlled, it may put you at risk for serious complications. Your personal target blood pressure may vary depending on your medical conditions, your age, and other factors. For most people, a normal blood pressure is less than 120/80. Hypertension is managed by lifestyle changes, medicines, or both. Lifestyle changes to help manage hypertension include losing weight, eating a healthy, low-sodium diet, exercising more, stopping smoking, and limiting alcohol. This information is not intended to replace advice given to you by your health care provider. Make sure you discuss any questions you have with your health care provider. Document Revised: 02/15/2021 Document Reviewed: 02/15/2021 Elsevier Patient Education  [place].   Sinus Infection, Adult A sinus infection is soreness  and swelling (inflammation) of your sinuses. Sinuses are hollow spaces in the bones around your face. They are located: Around your eyes. In the middle of your forehead. Behind your nose. In your cheekbones. Your sinuses and nasal passages are lined with a fluid called mucus. Mucus drains out of your sinuses. Swelling can trap mucus in your sinuses. This lets germs (bacteria, virus, or fungus) grow, which leads to infection. Most of the time, this condition is caused by a virus. What are the causes? Allergies. Asthma. Germs. Things that block your nose or sinuses. Growths in the nose (nasal polyps). Chemicals or irritants in the air. A fungus. This is rare. What increases the risk? Having a weak body defense system (immune system). Doing a lot of swimming or diving. Using nasal sprays too much. Smoking. What are the signs or symptoms? The main symptoms of this condition are pain and a feeling of pressure around the sinuses. Other symptoms include: Stuffy nose (congestion). This may make it hard to breathe through your nose. Runny nose (drainage). Soreness, swelling, and warmth in the sinuses.  A cough that may get worse at night. Being unable to smell and taste. Mucus that collects in the throat or the back of the nose (postnasal drip). This may cause a sore throat or bad breath. Being very tired (fatigued). A fever. How is this diagnosed? Your symptoms. Your medical history. A physical exam. Tests to find out if your condition is short-term (acute) or long-term (chronic). Your doctor may: Check your nose for growths (polyps). Check your sinuses using a tool that has a light on one end (endoscope). Check for allergies or germs. Do imaging tests, such as an MRI or CT scan. How is this treated? Treatment for this condition depends on the cause and whether it is short-term or long-term. If caused by a virus, your symptoms should go away on their own within 10 days. You may be  given medicines to relieve symptoms. They include: Medicines that shrink swollen tissue in the nose. A spray that treats swelling of the nostrils. Rinses that help get rid of thick mucus in your nose (nasal saline washes). Medicines that treat allergies (antihistamines). Over-the-counter pain relievers. If caused by bacteria, your doctor may wait to see if you will get better without treatment. You may be given antibiotic medicine if you have: A very bad infection. A weak body defense system. If caused by growths in the nose, surgery may be needed. Follow these instructions at home: Medicines Take, use, or apply over-the-counter and prescription medicines only as told by your doctor. These may include nasal sprays. If you were prescribed an antibiotic medicine, take it as told by your doctor. Do not stop taking it even if you start to feel better. Hydrate and humidify  Drink enough water to keep your pee (urine) pale yellow. Use a cool mist humidifier to keep the humidity level in your home above 50%. Breathe in steam for 10-15 minutes, 3-4 times a day, or as told by your doctor. You can do this in the bathroom while a hot shower is running. Try not to spend time in cool or dry air. Rest Rest as much as you can. Sleep with your head raised (elevated). Make sure you get enough sleep each night. General instructions  Put a warm, moist washcloth on your face 3-4 times a day, or as often as told by your doctor. Use nasal saline washes as often as told by your doctor. Wash your hands often with soap and water. If you cannot use soap and water, use hand sanitizer. Do not smoke. Avoid being around people who are smoking (secondhand smoke). Keep all follow-up visits. Contact a doctor if: You have a fever. Your symptoms get worse. Your symptoms do not get better within 10 days. Get help right away if: You have a very bad headache. You cannot stop vomiting. You have very bad pain or  swelling around your face or eyes. You have trouble seeing. You feel confused. Your neck is stiff. You have trouble breathing. These symptoms may be an emergency. Get help right away. Call 911. Do not wait to see if the symptoms will go away. Do not drive yourself to the hospital. Summary A sinus infection is swelling of your sinuses. Sinuses are hollow spaces in the bones around your face. This condition is caused by tissues in your nose that become inflamed or swollen. This traps germs. These can lead to infection. If you were prescribed an antibiotic medicine, take it as told by your doctor. Do not stop taking it even if  you start to feel better. Keep all follow-up visits. This information is not intended to replace advice given to you by your health care provider. Make sure you discuss any questions you have with your health care provider. Document Revised: 05/08/2021 Document Reviewed: 05/08/2021 Elsevier Patient Education  [place].    If you have been instructed to have an in-person evaluation today at a local Urgent Care facility, please use the link below. It will take you to a list of all of our available [facility], including address, phone number and hours of operation. Please do not delay care.  [facility]  If you or a family member do not have a primary care provider, use the link below to schedule a visit and establish care. When you choose a [facility] primary care physician or advanced practice provider, you gain a long-term partner in health. Find a Primary Care Provider  Learn more about [facility]'s in-office and virtual care options: [facility]

## 2022-01-02 NOTE — Progress Notes (Signed)
Virtual Visit Consent   Tara Fox, you are scheduled for a virtual visit with a [facility] provider today. Just as with appointments in the office, your consent must be obtained to participate. Your consent will be active for this visit and any virtual visit you may have with one of our providers in the next 365 days. If you have a MyChart account, a copy of this consent can be sent to you electronically.  As this is a virtual visit, video technology does not allow for your provider to perform a traditional examination. This may limit your provider's ability to fully assess your condition. If your provider identifies any concerns that need to be evaluated in person or the need to arrange testing (such as labs, EKG, etc.), we will make arrangements to do so. Although advances in technology are sophisticated, we cannot ensure that it will always work on either your end or our end. If the connection with a video visit is poor, the visit may have to be switched to a telephone visit. With either a video or telephone visit, we are not always able to ensure that we have a secure connection.  By engaging in this virtual visit, you consent to the provision of healthcare and authorize for your insurance to be billed (if applicable) for the services provided during this visit. Depending on your insurance coverage, you may receive a charge related to this service.  I need to obtain your verbal consent now. Are you willing to proceed with your visit today? [name] has provided verbal consent on 01/02/2022 for a virtual visit (video or telephone). Mar Daring, PA-C  Date: 01/02/2022 9:34 AM  Virtual Visit via Video Note   I, Mar Daring, connected with  Tara Fox  ([id number], 1991/04/07) on 01/02/22 at  9:30 AM EDT by a video-enabled telemedicine application and verified that I am speaking with the correct person using two identifiers.  Location: Patient: Virtual Visit Location Patient:  Home Provider: Virtual Visit Location Provider: Home Office   I discussed the limitations of evaluation and management by telemedicine and the availability of in person appointments. The patient expressed understanding and agreed to proceed.    History of Present Illness: [name] is a 31 y.o. who identifies as a female who was assigned female at birth, and is being seen today for possible sinus infection.  HPI: Sinusitis This is a new problem. The current episode started yesterday (headache kept her up all night). The problem has been gradually worsening since onset. There has been no fever. She is experiencing no pain. Associated symptoms include congestion, headaches and sinus pressure. Pertinent negatives include no chills, diaphoresis, ear pain, hoarse voice or sore throat. (Rhinorrhea, post nasal drainage) Past treatments include nothing. The treatment provided no relief.  Does have chronic allergies and reports worsening left sided congestion, tenderness over left maxillary sinus and the headache overnight. Headache is improving slightly this morning.    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    Allergies: No Known Allergies Medications:  Current Outpatient Medications:    amoxicillin-clavulanate (AUGMENTIN) 875-125 MG tablet, Take 1 tablet by mouth 2 (two) times daily., Disp: 14 tablet, Rfl: 0   busPIRone (BUSPAR) 5 MG tablet, Take 1 tablet (  5 mg total) by mouth 3 (three) times daily as needed., Disp: 60 tablet, Rfl: 1   cetirizine (ZYRTEC) 10 MG tablet, TAKE 1 TABLET BY MOUTH ONCE A DAY, Disp: 30 tablet, Rfl: 11   Ferrous Sulfate (IRON PO), Take by mouth daily., Disp: , Rfl:    fluticasone (FLONASE) 50 MCG/ACT nasal spray, USE 2  SPRAYS IN EACH NOSTRIL ONCE A DAY (Patient taking differently: Place 2 sprays into both nostrils as needed.), Disp: 16 g, Rfl: 6   Multiple Vitamins-Minerals (B COMPLEX-C-E-ZINC) tablet, Take 1 tablet by mouth daily., Disp: , Rfl:   Observations/Objective: Patient is well-developed, well-nourished in no acute distress.  Resting comfortably at home.  Head is normocephalic, atraumatic.  No labored breathing.  Speech is clear and coherent with logical content.  Patient is alert and oriented at baseline.    Assessment and Plan: 1. Acute bacterial sinusitis - amoxicillin-clavulanate (AUGMENTIN) 875-125 MG tablet; Take 1 tablet by mouth 2 (two) times daily.  Dispense: 14 tablet; Refill: 0  - Worsening symptoms that have not responded to OTC medications.  - Will give Augmentin - Continue allergy medications.  - Steam and humidifier can help - Stay well hydrated and get plenty of rest.  - Seek in person evaluation if no symptom improvement or if symptoms worsen   Follow Up Instructions: I discussed the assessment and treatment plan with the patient. The patient was provided an opportunity to ask questions and all were answered. The patient agreed with the plan and demonstrated an understanding of the instructions.  A copy of instructions were sent to the patient via MyChart unless otherwise noted below.    The patient was advised to call back or seek an in-person evaluation if the symptoms worsen or if the condition fails to improve as anticipated.  Time:  I spent 10 minutes with the patient via telehealth technology discussing the above problems/concerns.    Mar Daring, PA-C

## 2022-03-11 ENCOUNTER — Encounter: Payer: Self-pay | Admitting: *Deleted

## 2022-04-10 ENCOUNTER — Telehealth: Payer: No Typology Code available for payment source | Admitting: Family

## 2022-04-10 DIAGNOSIS — R399 Unspecified symptoms and signs involving the genitourinary system: Secondary | ICD-10-CM | POA: Diagnosis not present

## 2022-04-10 NOTE — Progress Notes (Signed)

## 2022-04-12 MED ORDER — CEPHALEXIN 500 MG PO CAPS: 500.0000 mg | ORAL_CAPSULE | Freq: Two times a day (BID) | ORAL | 0 refills | Status: DC

## 2022-04-12 NOTE — Addendum Note (Signed)
Addended by: Mar Daring on: 04/12/2022 07:03 AM   Modules accepted: Orders

## 2022-05-21 LAB — HM PAP SMEAR
HM Pap smear: NEGATIVE
HPV, high-risk: NEGATIVE

## 2022-05-24 ENCOUNTER — Ambulatory Visit: Payer: No Typology Code available for payment source

## 2022-05-24 ENCOUNTER — Ambulatory Visit (INDEPENDENT_AMBULATORY_CARE_PROVIDER_SITE_OTHER): Payer: No Typology Code available for payment source

## 2022-05-24 ENCOUNTER — Ambulatory Visit: Payer: No Typology Code available for payment source | Admitting: Podiatry

## 2022-05-24 DIAGNOSIS — M7672 Peroneal tendinitis, left leg: Secondary | ICD-10-CM

## 2022-05-24 NOTE — Progress Notes (Signed)
  Subjective:  Patient ID: Tara Fox, female    DOB: 07/23/1990,  MRN: [id number]  Chief Complaint  Patient presents with   Fracture    NP- possible Fractured Left foot - injured on Monday (needs MRI)    31 y.o. female presents with the above complaint.  Patient presents with complaint of left lateral foot pain has been going for quite some time is progressive gotten worse worse with ambulation worse with pressure she states that she did not injury that she can recall.  She occasionally hit it but nothing major.  She went to get it evaluated her pain scale is 7 out of 10 sharp shooting in nature   Review of Systems: Negative except as noted in the HPI. Denies N/V/F/Ch.  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   Sickle cell trait ([facility])     Current Outpatient Medications:    amoxicillin-clavulanate (AUGMENTIN) 875-125 MG tablet, Take 1 tablet by mouth 2 (two) times daily., Disp: 14 tablet, Rfl: 0   busPIRone (BUSPAR) 5 MG tablet, Take 1 tablet (5 mg total) by mouth 3 (three) times daily as needed., Disp: 60 tablet, Rfl: 1   cephALEXin (KEFLEX) 500 MG capsule, Take 1 capsule (500 mg total) by mouth 2 (two) times daily., Disp: 14 capsule, Rfl: 0   cetirizine (ZYRTEC) 10 MG tablet, TAKE 1 TABLET BY MOUTH ONCE A DAY, Disp: 30 tablet, Rfl: 11   Ferrous Sulfate (IRON PO), Take by mouth daily., Disp: , Rfl:    fluticasone (FLONASE) 50 MCG/ACT nasal spray, USE 2 SPRAYS IN EACH NOSTRIL ONCE A DAY (Patient taking differently: Place 2 sprays into both nostrils as needed.), Disp: 16 g, Rfl: 6   Multiple Vitamins-Minerals (B COMPLEX-C-E-ZINC) tablet, Take 1 tablet by mouth daily., Disp: , Rfl:   Social History   Tobacco Use  Smoking Status Never  Smokeless Tobacco Never    No Known Allergies Objective:  There were no vitals filed for this visit. There is no height or weight on file to calculate BMI. Constitutional Well  developed. Well nourished.  Vascular Dorsalis pedis pulses palpable bilaterally. Posterior tibial pulses palpable bilaterally. Capillary refill normal to all digits.  No cyanosis or clubbing noted. Pedal hair growth normal.  Neurologic Normal speech. Oriented to person, place, and time. Epicritic sensation to light touch grossly present bilaterally.  Dermatologic Nails well groomed and normal in appearance. No open wounds. No skin lesions.  Orthopedic: Pain along the course of peroneal tendon primarily at the insertion.  Pain with dorsiflexion eversion of the foot no pain with plantarflexion inversion of the foot.  No pain at the posterior tibial Achilles tendon ATFL ligament   Radiographs: 3 views of skeletally mature adult left foot: No fractures noted no bony abnormalities identified. Assessment:   1. Peroneal tendinitis of left lower extremity    Plan:  Patient was evaluated and treated and all questions answered.  Left peroneal tendinitis -All questions and concerns were discussed with the patient in extensive detail.  Given the amount of pain that she is having she will benefit from a cam boot immobilization to allow the soft tissue structure to heal itself.  Patient agrees with plan like to proceed with cam boot immobilization -Cam boot was dispensed if there is no improvement discussed her injection versus MRI  No follow-ups on file.

## 2022-05-30 ENCOUNTER — Encounter: Payer: Self-pay | Admitting: *Deleted

## 2022-06-21 ENCOUNTER — Ambulatory Visit: Payer: No Typology Code available for payment source | Admitting: Podiatry

## 2022-06-21 VITALS — BP 120/68

## 2022-06-21 DIAGNOSIS — M7672 Peroneal tendinitis, left leg: Secondary | ICD-10-CM

## 2022-06-21 NOTE — Progress Notes (Signed)
Subjective:  Patient ID: Tara Fox, female    DOB: 07-24-1990,  MRN: [id number]  Chief Complaint  Patient presents with   Foot Pain    32 y.o. female presents with the above complaint.  Patient presents with complaint of left lateral foot pain has been going for quite some time is progressive gotten worse worse with ambulation worse with pressure she states that she did not injury that she can recall.  She occasionally hit it but nothing major.  She went to get it evaluated her pain scale is 7 out of 10 sharp shooting in nature   Review of Systems: Negative except as noted in the HPI. Denies N/V/F/Ch.  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   Sickle cell trait (HCC)     Current Outpatient Medications:    amoxicillin-clavulanate (AUGMENTIN) 875-125 MG tablet, Take 1 tablet by mouth 2 (two) times daily., Disp: 14 tablet, Rfl: 0   busPIRone (BUSPAR) 5 MG tablet, Take 1 tablet (5 mg total) by mouth 3 (three) times daily as needed., Disp: 60 tablet, Rfl: 1   cephALEXin (KEFLEX) 500 MG capsule, Take 1 capsule (500 mg total) by mouth 2 (two) times daily., Disp: 14 capsule, Rfl: 0   cetirizine (ZYRTEC) 10 MG tablet, TAKE 1 TABLET BY MOUTH ONCE A DAY, Disp: 30 tablet, Rfl: 11   Ferrous Sulfate (IRON PO), Take by mouth daily., Disp: , Rfl:    fluticasone (FLONASE) 50 MCG/ACT nasal spray, USE 2 SPRAYS IN EACH NOSTRIL ONCE A DAY (Patient taking differently: Place 2 sprays into both nostrils as needed.), Disp: 16 g, Rfl: 6   Multiple Vitamins-Minerals (B COMPLEX-C-E-ZINC) tablet, Take 1 tablet by mouth daily., Disp: , Rfl:   Social History   Tobacco Use  Smoking Status Never  Smokeless Tobacco Never    No Known Allergies Objective:   Vitals:   06/21/22 1142  BP: 120/68   There is no height or weight on file to calculate BMI. Constitutional Well developed. Well nourished.  Vascular Dorsalis pedis pulses palpable  bilaterally. Posterior tibial pulses palpable bilaterally. Capillary refill normal to all digits.  No cyanosis or clubbing noted. Pedal hair growth normal.  Neurologic Normal speech. Oriented to person, place, and time. Epicritic sensation to light touch grossly present bilaterally.  Dermatologic Nails well groomed and normal in appearance. No open wounds. No skin lesions.  Orthopedic: Pain along the course of peroneal tendon primarily at the insertion.  Pain with dorsiflexion eversion of the foot no pain with plantarflexion inversion of the foot.  No pain at the posterior tibial Achilles tendon ATFL ligament   Radiographs: 3 views of skeletally mature adult left foot: No fractures noted no bony abnormalities identified. Assessment:   1. Peroneal tendinitis of left lower extremity     Plan:  Patient was evaluated and treated and all questions answered.  Left peroneal tendinitis -All questions and concerns were discussed with the patient in extensive detail.  - -Patient will begin to transition out of the cam boot into regular shoes with a Tri-Lock ankle brace Tri-Lock ankle brace was dispensed. -Given that patient still has residual pain she will benefit from steroid injection at decreasing inflammatory component associate with pain.  Patient agrees with plan like to proceed with steroid injection.  Discussed risk of rupture associated with it.  She states understanding. -A steroid injection was performed at left lateral midfoot at  point of maximal tenderness using 1% plain Lidocaine and 10 mg of Kenalog. This was well tolerated.   No follow-ups on file.

## 2022-06-25 ENCOUNTER — Telehealth: Payer: No Typology Code available for payment source | Admitting: Physician Assistant

## 2022-06-25 DIAGNOSIS — R197 Diarrhea, unspecified: Secondary | ICD-10-CM

## 2022-06-25 MED ORDER — ONDANSETRON HCL 4 MG PO TABS: 4.0000 mg | ORAL_TABLET | Freq: Three times a day (TID) | ORAL | 0 refills | Status: DC | PRN

## 2022-06-25 NOTE — Progress Notes (Signed)
We are sorry that you are not feeling well.  Here is how we plan to help!  Based on what you have shared with me it looks like you have Acute Infectious Diarrhea.  Most cases of acute diarrhea are due to infections with virus and bacteria and are self-limited conditions lasting less than 14 days.  For your symptoms you may take Imodium 2 mg tablets that are over the counter at your local pharmacy. Take two tablet now and then one after each loose stool up to 6 a day.  Antibiotics are not needed for most people with diarrhea.  Optional: Zofran 4 mg 1 tablet every 8 hours as needed for nausea and vomiting   HOME CARE We recommend changing your diet to help with your symptoms for the next few days. Drink plenty of fluids that contain water salt and sugar. Sports drinks such as Gatorade may help.  You may try broths, soups, bananas, applesauce, soft breads, mashed potatoes or crackers.  You are considered infectious for as long as the diarrhea continues. Hand washing or use of alcohol based hand sanitizers is recommend. It is best to stay out of work or school until your symptoms stop.   GET HELP RIGHT AWAY If you have dark yellow colored urine or do not pass urine frequently you should drink more fluids.   If your symptoms worsen  If you feel like you are going to pass out (faint) You have a new problem  MAKE SURE YOU  Understand these instructions. Will watch your condition. Will get help right away if you are not doing well or get worse.  Thank you for choosing an e-visit.  Your e-visit answers were reviewed by a board certified advanced clinical practitioner to complete your personal care plan. Depending upon the condition, your plan could have included both over the counter or prescription medications.  Please review your pharmacy choice. Make sure the pharmacy is open so you can pick up prescription now. If there is a problem, you may contact your provider through MyChart  messaging and have the prescription routed to another pharmacy.  Your safety is important to us. If you have drug allergies check your prescription carefully.   For the next 24 hours you can use MyChart to ask questions about today's visit, request a non-urgent call back, or ask for a work or school excuse. You will get an email in the next two days asking about your experience. I hope that your e-visit has been valuable and will speed your recovery.  I have spent 5 minutes in review of e-visit questionnaire, review and updating patient chart, medical decision making and response to patient.   Gissell Barra M Josue Falconi, PA-C  

## 2022-08-02 ENCOUNTER — Encounter: Payer: Self-pay | Admitting: Physician Assistant

## 2022-08-02 MED ORDER — BUSPIRONE HCL 5 MG PO TABS: 5.0000 mg | ORAL_TABLET | Freq: Three times a day (TID) | ORAL | 0 refills | Status: DC | PRN

## 2022-08-11 ENCOUNTER — Telehealth: Payer: No Typology Code available for payment source | Admitting: Physician Assistant

## 2022-08-11 DIAGNOSIS — R197 Diarrhea, unspecified: Secondary | ICD-10-CM

## 2022-08-11 DIAGNOSIS — R112 Nausea with vomiting, unspecified: Secondary | ICD-10-CM

## 2022-08-11 MED ORDER — ONDANSETRON HCL 4 MG PO TABS: 4.0000 mg | ORAL_TABLET | Freq: Three times a day (TID) | ORAL | 0 refills | Status: AC | PRN

## 2022-08-11 NOTE — Progress Notes (Signed)
Virtual Visit Consent   Tara Fox, you are scheduled for a virtual visit with a [facility] provider today. Just as with appointments in the office, your consent must be obtained to participate. Your consent will be active for this visit and any virtual visit you may have with one of our providers in the next 365 days. If you have a MyChart account, a copy of this consent can be sent to you electronically.  As this is a virtual visit, video technology does not allow for your provider to perform a traditional examination. This may limit your provider's ability to fully assess your condition. If your provider identifies any concerns that need to be evaluated in person or the need to arrange testing (such as labs, EKG, etc.), we will make arrangements to do so. Although advances in technology are sophisticated, we cannot ensure that it will always work on either your end or our end. If the connection with a video visit is poor, the visit may have to be switched to a telephone visit. With either a video or telephone visit, we are not always able to ensure that we have a secure connection.  By engaging in this virtual visit, you consent to the provision of healthcare and authorize for your insurance to be billed (if applicable) for the services provided during this visit. Depending on your insurance coverage, you may receive a charge related to this service.  I need to obtain your verbal consent now. Are you willing to proceed with your visit today? Tara Fox has provided verbal consent on 08/11/2022 for a virtual visit (video or telephone). Tara Arena Ward, PA-C  Date: 08/11/2022 1:29 PM  Virtual Visit via Video Note   I, Tara Fox, connected with  Tara Fox  ([id number], 1991-04-04) on 08/11/22 at  1:30 PM EST by a video-enabled telemedicine application and verified that I am speaking with the correct person using two identifiers.  Location: Patient: Virtual Visit Location Patient:  Home Provider: Virtual Visit Location Provider: Home Office   I discussed the limitations of evaluation and management by telemedicine and the availability of in person appointments. The patient expressed understanding and agreed to proceed.    History of Present Illness: Tara Fox is a 32 y.o. who identifies as a female who was assigned female at birth, and is being seen today for diarrhea that started two days ago.  Reports some nausea, vomiting.  No nausea today.  Keeping fluids down ok.  She has taken imodium with minimal relief. Denies fever, chills, body aches, congestion, cough.   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    Allergies: No Known Allergies Medications:  Current Outpatient Medications:    amoxicillin-clavulanate (AUGMENTIN) 875-125 MG tablet, Take 1 tablet by mouth 2 (two) times daily., Disp: 14 tablet, Rfl: 0   busPIRone (BUSPAR) 5 MG tablet, Take 1 tablet (5 mg total) by mouth 3 (three) times daily as needed., Disp: 30 tablet, Rfl: 0   cephALEXin (KEFLEX) 500 MG capsule, Take 1 capsule (500 mg total) by mouth 2 (two) times daily., Disp: 14 capsule, Rfl: 0   cetirizine (ZYRTEC) 10 MG tablet, TAKE 1 TABLET BY MOUTH ONCE A DAY, Disp: 30 tablet, Rfl: 11   Ferrous Sulfate (IRON PO), Take by mouth daily., Disp: , Rfl:  fluticasone (FLONASE) 50 MCG/ACT nasal spray, USE 2 SPRAYS IN EACH NOSTRIL ONCE A DAY (Patient taking differently: Place 2 sprays into both nostrils as needed.), Disp: 16 g, Rfl: 6   Multiple Vitamins-Minerals (B COMPLEX-C-E-ZINC) tablet, Take 1 tablet by mouth daily., Disp: , Rfl:    ondansetron (ZOFRAN) 4 MG tablet, Take 1 tablet (4 mg total) by mouth every 8 (eight) hours as needed for nausea or vomiting., Disp: 20 tablet, Rfl: 0  Observations/Objective: Patient is  well-developed, well-nourished in no acute distress.  Resting comfortably at home.  Head is normocephalic, atraumatic.  No labored breathing.  Speech is clear and coherent with logical content.  Patient is alert and oriented at baseline.    Assessment and Plan: There are no diagnoses linked to this encounter.   Follow Up Instructions: I discussed the assessment and treatment plan with the patient. The patient was provided an opportunity to ask questions and all were answered. The patient agreed with the plan and demonstrated an understanding of the instructions.  A copy of instructions were sent to the patient via MyChart unless otherwise noted below.     The patient was advised to call back or seek an in-person evaluation if the symptoms worsen or if the condition fails to improve as anticipated.  Time:  I spent 4 minutes with the patient via telehealth technology discussing the above problems/concerns.    Tara Arena Ward, PA-C

## 2022-08-11 NOTE — Patient Instructions (Signed)
  Tara Fox, thank you for joining [place], PA-C for today's virtual visit.  While this provider is not your primary care provider (PCP), if your PCP is located in our provider database this encounter information will be shared with them immediately following your visit.   [facility] account gives you access to today's visit and all your visits, tests, and labs performed at [facility] " click here if you don't have a [facility] account or go to [email or web address]  Consent: (Patient) Tara Fox provided verbal consent for this virtual visit at the beginning of the encounter.  Current Medications:  Current Outpatient Medications:    amoxicillin-clavulanate (AUGMENTIN) 875-125 MG tablet, Take 1 tablet by mouth 2 (two) times daily., Disp: 14 tablet, Rfl: 0   busPIRone (BUSPAR) 5 MG tablet, Take 1 tablet (5 mg total) by mouth 3 (three) times daily as needed., Disp: 30 tablet, Rfl: 0   cephALEXin (KEFLEX) 500 MG capsule, Take 1 capsule (500 mg total) by mouth 2 (two) times daily., Disp: 14 capsule, Rfl: 0   cetirizine (ZYRTEC) 10 MG tablet, TAKE 1 TABLET BY MOUTH ONCE A DAY, Disp: 30 tablet, Rfl: 11   Ferrous Sulfate (IRON PO), Take by mouth daily., Disp: , Rfl:    fluticasone (FLONASE) 50 MCG/ACT nasal spray, USE 2 SPRAYS IN EACH NOSTRIL ONCE A DAY (Patient taking differently: Place 2 sprays into both nostrils as needed.), Disp: 16 g, Rfl: 6   Multiple Vitamins-Minerals (B COMPLEX-C-E-ZINC) tablet, Take 1 tablet by mouth daily., Disp: , Rfl:    ondansetron (ZOFRAN) 4 MG tablet, Take 1 tablet (4 mg total) by mouth every 8 (eight) hours as needed for nausea or vomiting., Disp: 20 tablet, Rfl: 0   Medications ordered in this encounter:  No orders of the defined types were placed in this encounter.    *If you need refills on other medications prior to your next appointment, please contact your pharmacy*  Follow-Up: Call back or seek an in-person  evaluation if the symptoms worsen or if the condition fails to improve as anticipated.  [place] [facility] [phone]  Other Instructions Continue with Imodium as needed.  Can take zofran as needed for nausea and vomiting. Drink plenty of fluids.  If no improvement follow up with PCP.   If you have been instructed to have an in-person evaluation today at a local Urgent Care facility, please use the link below. It will take you to a list of all of our available [facility], including address, phone number and hours of operation. Please do not delay care.  [facility]  If you or a family member do not have a primary care provider, use the link below to schedule a visit and establish care. When you choose a [facility] primary care physician or advanced practice provider, you gain a long-term partner in health. Find a Primary Care Provider  Learn more about [facility]'s in-office and virtual care options: [facility]

## 2022-09-24 ENCOUNTER — Encounter: Payer: Self-pay | Admitting: Physician Assistant

## 2022-09-24 ENCOUNTER — Ambulatory Visit (INDEPENDENT_AMBULATORY_CARE_PROVIDER_SITE_OTHER): Payer: No Typology Code available for payment source | Admitting: Physician Assistant

## 2022-09-24 VITALS — BP 140/100 | HR 88 | Temp 97.3°F | Ht 69.0 in | Wt 302.4 lb

## 2022-09-24 DIAGNOSIS — Z0000 Encounter for general adult medical examination without abnormal findings: Secondary | ICD-10-CM | POA: Diagnosis not present

## 2022-09-24 DIAGNOSIS — F419 Anxiety disorder, unspecified: Secondary | ICD-10-CM

## 2022-09-24 DIAGNOSIS — E669 Obesity, unspecified: Secondary | ICD-10-CM

## 2022-09-24 DIAGNOSIS — R030 Elevated blood-pressure reading, without diagnosis of hypertension: Secondary | ICD-10-CM | POA: Diagnosis not present

## 2022-09-24 DIAGNOSIS — F909 Attention-deficit hyperactivity disorder, unspecified type: Secondary | ICD-10-CM | POA: Diagnosis not present

## 2022-09-24 DIAGNOSIS — F32A Depression, unspecified: Secondary | ICD-10-CM

## 2022-09-24 LAB — LIPID PANEL
Cholesterol: 132 mg/dL (ref 0–200)
HDL: 41 mg/dL (ref >39.00)
LDL Cholesterol: 76 mg/dL (ref 0–99)
NonHDL: 90.85
Total CHOL/HDL Ratio: 3
Triglycerides: 75 mg/dL (ref 0.0–149.0)
VLDL: 15 mg/dL (ref 0.0–40.0)

## 2022-09-24 LAB — CBC WITH DIFFERENTIAL/PLATELET
Basophils Absolute: 0 10*3/uL (ref 0.0–0.1)
Basophils Relative: 0.5 % (ref 0.0–3.0)
Eosinophils Absolute: 0.1 10*3/uL (ref 0.0–0.7)
Eosinophils Relative: 1.4 % (ref 0.0–5.0)
HCT: 36.7 % (ref 36.0–46.0)
Hemoglobin: 11.5 g/dL — ABNORMAL LOW (ref 12.0–15.0)
Lymphocytes Relative: 32.9 % (ref 12.0–46.0)
Lymphs Abs: 1.7 10*3/uL (ref 0.7–4.0)
MCHC: 31.4 g/dL (ref 30.0–36.0)
MCV: 74.5 fl — ABNORMAL LOW (ref 78.0–100.0)
Monocytes Absolute: 0.3 10*3/uL (ref 0.1–1.0)
Monocytes Relative: 6.8 % (ref 3.0–12.0)
Neutro Abs: 2.9 10*3/uL (ref 1.4–7.7)
Neutrophils Relative %: 58.4 % (ref 43.0–77.0)
Platelets: 315 10*3/uL (ref 150.0–400.0)
RBC: 4.93 Mil/uL (ref 3.87–5.11)
RDW: 20.2 % — ABNORMAL HIGH (ref 11.5–15.5)
WBC: 5 10*3/uL (ref 4.0–10.5)

## 2022-09-24 LAB — COMPREHENSIVE METABOLIC PANEL
ALT: 8 U/L (ref 0–35)
AST: 12 U/L (ref 0–37)
Albumin: 3.8 g/dL (ref 3.5–5.2)
Alkaline Phosphatase: 57 U/L (ref 39–117)
BUN: 9 mg/dL (ref 6–23)
CO2: 25 mEq/L (ref 19–32)
Calcium: 9.2 mg/dL (ref 8.4–10.5)
Chloride: 107 mEq/L (ref 96–112)
Creatinine, Ser: 0.97 mg/dL (ref 0.40–1.20)
GFR: 76.65 mL/min (ref >60.00)
Glucose, Bld: 104 mg/dL — ABNORMAL HIGH (ref 70–99)
Potassium: 4.1 mEq/L (ref 3.5–5.1)
Sodium: 140 mEq/L (ref 135–145)
Total Bilirubin: 0.3 mg/dL (ref 0.2–1.2)
Total Protein: 6.8 g/dL (ref 6.0–8.3)

## 2022-09-24 LAB — TSH: TSH: 3.98 u[IU]/mL (ref 0.35–5.50)

## 2022-09-24 MED ORDER — SERTRALINE HCL 25 MG PO TABS: 25.0000 mg | ORAL_TABLET | Freq: Every day | ORAL | 3 refills | Status: DC

## 2022-09-24 NOTE — Patient Instructions (Addendum)
It was great to see you!  Start zoloft 25 mg daily Tell someone you trust that you are starting this If you develop suicidal thoughts, please tell someone and immediately proceed to our local 24/7 crisis center, [facility]. [address] [place], [place] [address] [phone].  Follow-up with me in 1 month.  Please check your blood pressure 3-4 times a week and document.  Consider [facility] for your Vyvanse -- they tend to have better availability   Please go to the lab for blood work.   Our office will call you with your results unless you have chosen to receive results via MyChart.  If your blood work is normal we will follow-up each year for physicals and as scheduled for chronic medical problems.  If anything is abnormal we will treat accordingly and get you in for a follow-up.  Take care,  Lelon Mast

## 2022-09-24 NOTE — Progress Notes (Signed)
Subjective:    Tara Fox is a 32 y.o. female and is here for a comprehensive physical exam.  Depression        Associated symptoms include no myalgias and no headaches.   Health Maintenance Due  Topic Date Due   PAP SMEAR-Modifier  10/11/2022   Acute Concerns: Anxiety and Depression Having some increased crying spells, specifically prior to going out in public Denies SI/HI Has taken Buspar with some relief but forgets to take it  Has therapist that she sees monthly  Elevated blood pressure Currently taking no medication. At home blood pressure readings are: not checked. Patient denies chest pain, SOB, blurred vision, dizziness, unusual headaches, lower leg swelling. Denies excessive caffeine intake, stimulant usage, excessive alcohol intake, or increase in salt consumption.  BP Readings from Last 3 Encounters:  09/24/22 (!) 140/100  06/21/22 120/68  07/09/21 (!) 130/98   Chronic Issues: ADHD Sees [facility] Was doing well with Vyvanse but has had issues with supply Now taking Azstarys and finds it less effective   Health Maintenance: Immunizations -- UTD Colonoscopy -- n/a Mammogram -- n/a PAP -- UTD per patient; follows Dr Cherly Hensenousins for her fibroids Bone Density -- n/a Diet -- trying to work on healthy diet as able Exercise -- trying to work on increasing activity  Sleep habits -- no major concerns at this time Mood -- see above  UTD with dentist? - yes UTD with eye doctor? - yes  Weight history: Wt Readings from Last 10 Encounters:  09/24/22 (!) 302 lb 6.1 oz (137.2 kg)  07/09/21 (!) 327 lb 6.1 oz (148.5 kg)  08/11/20 (!) 321 lb (145.6 kg)  05/16/16 (!) 325 lb (147.4 kg)  10/03/14 (!) 313 lb (142 kg)  12/09/13 (!) 314 lb (142.4 kg)  07/05/13 (!) 321 lb (145.6 kg)  10/16/11 (!) 307 lb (139.3 kg)  10/03/11 (!) 307 lb (139.3 kg)  04/18/11 (!) 310 lb (140.6 kg)   Body mass index is 44.65 kg/m. Patient's last menstrual period was  09/16/2022 (exact date).  Alcohol use:  reports current alcohol use of about 3.0 standard drinks of alcohol per week.  Tobacco use:  Tobacco Use: Low Risk  (09/24/2022)   Patient History    Smoking Tobacco Use: Never    Smokeless Tobacco Use: Never    Passive Exposure: Not on file   Eligible for lung cancer screening? no     09/24/2022    8:52 AM  Depression screen PHQ 2/9  Decreased Interest 0  Down, Depressed, Hopeless 2  PHQ - 2 Score 2  Altered sleeping 0  Tired, decreased energy 0  Change in appetite 0  Feeling bad or failure about yourself  0  Trouble concentrating 1  Moving slowly or fidgety/restless 0  Suicidal thoughts 0  PHQ-9 Score 3  Difficult doing work/chores Very difficult     Other providers/specialists: Patient Care Team: Jarold MottoWorley, Kiron Osmun, [place] as PCP - General (Physician Assistant)    PMHx, SurgHx, SocialHx, Medications, and Allergies were reviewed in the Visit Navigator and updated as appropriate.   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   Sickle cell trait      Past Surgical History:  Procedure Laterality Date   HERNIA REPAIR  1988     Family History  Problem Relation Age of Onset   Diabetes Mother  Hypertension Mother    Alcohol abuse Father    Hypertension Father    Sickle cell anemia Brother    Stroke Brother        x 3    Cancer Maternal Aunt        breast CA, 2 aunts   Diabetes Maternal Aunt    Colon cancer Maternal Grandmother 27   Dementia Paternal Grandmother     Social History   Tobacco Use   Smoking status: Never   Smokeless tobacco: Never  Vaping Use   Vaping Use: Never used  Substance Use Topics   Alcohol use: Yes    Alcohol/week: 3.0 standard drinks of alcohol    Types: 1 Glasses of wine, 2 Shots of liquor per week    Comment: Occasional. Not weekly   Drug use: Never    Review of Systems:   Review of Systems   Constitutional:  Negative for chills, fever, malaise/fatigue and weight loss.  HENT:  Negative for hearing loss, sinus pain and sore throat.   Respiratory:  Negative for cough and hemoptysis.   Cardiovascular:  Negative for chest pain, palpitations, leg swelling and PND.  Gastrointestinal:  Negative for abdominal pain, constipation, diarrhea, heartburn, nausea and vomiting.  Genitourinary:  Negative for dysuria, frequency and urgency.  Musculoskeletal:  Negative for back pain, myalgias and neck pain.  Skin:  Negative for itching and rash.  Neurological:  Negative for dizziness, tingling, seizures and headaches.  Endo/Heme/Allergies:  Negative for polydipsia.  Psychiatric/Behavioral:  Positive for depression. The patient is not nervous/anxious.     Objective:   BP (!) 140/100 (BP Location: Left Arm, Patient Position: Sitting, Cuff Size: Large)   Pulse 88   Temp (!) 97.3 F (36.3 C) (Temporal)   Ht 5\' 9"  (1.753 m)   Wt (!) 302 lb 6.1 oz (137.2 kg)   LMP 09/16/2022 (Exact Date)   BMI 44.65 kg/m  Body mass index is 44.65 kg/m.   General Appearance:    Alert, cooperative, no distress, appears stated age  Head:    Normocephalic, without obvious abnormality, atraumatic  Eyes:    PERRL, conjunctiva/corneas clear, EOM's intact, fundi    benign, both eyes  Ears:    Normal TM's and external ear canals, both ears  Nose:   Nares normal, septum midline, mucosa normal, no drainage    or sinus tenderness  Throat:   Lips, mucosa, and tongue normal; teeth and gums normal B/l Tonsillar hypertrophy  Neck:   Supple, symmetrical, trachea midline, no adenopathy;    thyroid:  no enlargement/tenderness/nodules; no carotid   bruit or JVD  Back:     Symmetric, no curvature, ROM normal, no CVA tenderness  Lungs:     Clear to auscultation bilaterally, respirations unlabored  Chest Wall:    No tenderness or deformity   Heart:    Regular rate and rhythm, S1 and S2 normal, no murmur, rub or gallop   Breast Exam:    Deferred   Abdomen:     Soft, non-tender, bowel sounds active all four quadrants,    no organomegaly Palpable uterus in lower abdomen, mostly in RLQ  Genitalia:    Deferred   Extremities:   Extremities normal, atraumatic, no cyanosis or edema  Pulses:   2+ and symmetric all extremities  Skin:   Skin color, texture, turgor normal, no rashes or lesions  Lymph nodes:   Cervical, supraclavicular, and axillary nodes normal  Neurologic:   CNII-XII intact, normal strength, sensation and reflexes  throughout    Assessment/Plan:   Routine physical examination Today patient counseled on age appropriate routine health concerns for screening and prevention, each reviewed and up to date or declined. Immunizations reviewed and up to date or declined. Labs ordered and reviewed. Risk factors for depression reviewed and negative. Hearing function and visual acuity are intact. ADLs screened and addressed as needed. Functional ability and level of safety reviewed and appropriate. Education, counseling and referrals performed based on assessed risks today. Patient provided with a copy of personalized plan for preventive services.  Elevated blood pressure reading Above goal No evidence of end organ damage We reviewed her last 3 clinic BP and has had at least one in normal range She has also had about 20 lb weight loss as well Recommend home monitoring of BP and we will recheck in 1 month and decide about medications at that time -- sooner if concerns Also briefly discussed OSA eval - does have enlarged tonsils and endorses daytime fatigue -- declines OSA eval at this time  Obesity, unspecified classification, unspecified obesity type, unspecified whether serious comorbidity present Continue healthy lifestyle efforts  Attention deficit hyperactivity disorder (ADHD), unspecified ADHD type Mgmt per [facility]  Anxiety and depression Uncontrolled Start zoloft 25 mg  daily Side effects, risks/benefits discussed Recommend close follow-up with talk therapist Denies SI/HI I discussed with patient that if they develop any SI, to tell someone immediately and seek medical attention. Follow-up in 1 month  Jarold Motto, PA-C [facility]

## 2022-10-17 ENCOUNTER — Other Ambulatory Visit: Payer: Self-pay | Admitting: Physician Assistant

## 2022-10-22 ENCOUNTER — Ambulatory Visit: Payer: No Typology Code available for payment source | Admitting: Physician Assistant

## 2022-10-23 ENCOUNTER — Encounter: Payer: Self-pay | Admitting: Physician Assistant

## 2022-10-23 ENCOUNTER — Ambulatory Visit (INDEPENDENT_AMBULATORY_CARE_PROVIDER_SITE_OTHER): Payer: No Typology Code available for payment source | Admitting: Physician Assistant

## 2022-10-23 VITALS — BP 142/100 | HR 100 | Ht 69.0 in | Wt 298.5 lb

## 2022-10-23 DIAGNOSIS — F32A Depression, unspecified: Secondary | ICD-10-CM

## 2022-10-23 DIAGNOSIS — R030 Elevated blood-pressure reading, without diagnosis of hypertension: Secondary | ICD-10-CM

## 2022-10-23 DIAGNOSIS — F419 Anxiety disorder, unspecified: Secondary | ICD-10-CM | POA: Diagnosis not present

## 2022-10-23 MED ORDER — BLOOD PRESSURE KIT: 1.0000 | PACK | Freq: Once | 0 refills | Status: AC

## 2022-10-23 MED ORDER — AMLODIPINE BESYLATE 2.5 MG PO TABS: 2.5000 mg | ORAL_TABLET | Freq: Every day | ORAL | 1 refills | Status: DC

## 2022-10-23 NOTE — Patient Instructions (Signed)
It was great to see you!  [employer] brand -- is my favorite type of monitor -- I will try to send this in for you  I will send in Amlodipine 2.5 mg daily  Continue to monitor your blood pressure  Please follow-up in 3 months, sooner if concerns  Take care,  Jarold Motto PA-C

## 2022-10-23 NOTE — Progress Notes (Signed)
Tara Fox is a 32 y.o. female here for a follow up of a pre-existing problem.  History of Present Illness:   Chief Complaint  Patient presents with   f/u elevated blood pressure    Pt has been checking blood pressure at home systolic 145-192, diastolic 92-117. Pt thinks her cuff is not working right. Bp checked yesterday at an appt 134/84.    HPI  Elevated Blood Pressure Not currently taking medications.  Blood pressure elevated today at 142/100. Blood pressures measured at home are as follows: 145-192 systolic, 92-117 diastolic. Believes her BP cuff is not taking accurate readings. Denies chest pain, SOB, blurred vision, dizziness, unusual headaches, lower leg swelling.  Denies changes in salt/alcohol intake, exercise. Not interested in sleep study. Family history of hypertension in mother, father. Family history of stroke in brother. Does not think she is pregnant and does not plan on becoming pregnant soon.  Anxiety and Depression Treated with Zoloft 25 mg daily. This is helping symptoms without any negative side effects. 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 Not weekly   Drug use: Never    Past Surgical History:  Procedure Laterality Date   HERNIA REPAIR  1988    Family History  Problem Relation Age of Onset   Diabetes Mother    Hypertension Mother    Alcohol abuse Father    Hypertension Father    Sickle cell anemia Brother    Stroke Brother        x 3    Cancer Maternal Aunt        breast CA, 2 aunts   Diabetes  Maternal Aunt    Colon cancer Maternal Grandmother 4   Dementia Paternal Grandmother     Allergies  Allergen Reactions   Dust Mite Extract     Current Medications:   Current Outpatient Medications:    amLODipine (NORVASC) 2.5 MG tablet, Take 1 tablet (2.5 mg total) by mouth daily., Disp: 30 tablet, Rfl: 1   AZSTARYS 39.2-7.8 MG CAPS, Take 1 capsule by mouth daily., Disp: , Rfl:    Blood Pressure KIT, 1 each by Does not apply route once for 1 dose., Disp: 1 kit, Rfl: 0   busPIRone (BUSPAR) 5 MG tablet, Take 1 tablet (5 mg total) by mouth 3 (three) times daily as needed., Disp: 30 tablet, Rfl: 0   Ferrous Sulfate (IRON PO), Take by mouth daily., Disp: , Rfl:    Multiple Vitamins-Minerals (B COMPLEX-C-E-ZINC) tablet, Take 1 tablet by mouth daily., Disp: , Rfl:    sertraline (ZOLOFT) 25 MG tablet, TAKE 1 TABLET (25 MG TOTAL) BY MOUTH DAILY., Disp: 90 tablet, Rfl: 1   cetirizine (ZYRTEC) 10 MG tablet, TAKE 1 TABLET BY MOUTH ONCE A DAY, Disp: 30 tablet, Rfl: 11   Review of Systems:   Review of Systems  Constitutional:  Negative for fever and malaise/fatigue.  HENT:  Negative for congestion.  Eyes:  Negative for blurred vision.  Respiratory:  Negative for cough and shortness of breath.   Cardiovascular:  Negative for chest pain, palpitations and leg swelling.  Gastrointestinal:  Negative for vomiting.  Musculoskeletal:  Negative for back pain.  Skin:  Negative for rash.  Neurological:  Negative for dizziness, loss of consciousness and headaches.    Vitals:   Vitals:   10/23/22 0844 10/23/22 0914  BP: (!) 150/100 (!) 142/100  Pulse: 100   SpO2: 100%   Weight: 298 lb 8 oz (135.4 kg)   Height: 5\' 9"  (1.753 m)      Body mass index is 44.08 kg/m.  Physical Exam:   Physical Exam Vitals and nursing note reviewed.  Constitutional:      General: She is not in acute distress.    Appearance: She is well-developed. She is not ill-appearing or toxic-appearing.  Cardiovascular:      Rate and Rhythm: Normal rate and regular rhythm.     Pulses: Normal pulses.     Heart sounds: Normal heart sounds, S1 normal and S2 normal.  Pulmonary:     Effort: Pulmonary effort is normal.     Breath sounds: Normal breath sounds.  Skin:    General: Skin is warm and dry.  Neurological:     Mental Status: She is alert.     GCS: GCS eye subscore is 4. GCS verbal subscore is 5. GCS motor subscore is 6.  Psychiatric:        Speech: Speech normal.        Behavior: Behavior normal. Behavior is cooperative.     Assessment and Plan:   Elevated blood pressure reading Above goal  no evidence of endorgan damage Recommend starting with 2.5 mg amlodipine Continue to monitor blood pressure regularly and follow-up in 3 months, sooner if concerns  Anxiety and depression Symptoms have improved Continue Zoloft 25 mg daily Follow-up in 3 months, sooner if concerns   I,Alexander Ruley,acting as a scribe for [employer], PA.,have documented all relevant documentation on the behalf of Jarold Motto, PA,as directed by  Jarold Motto, PA while in the presence of Jarold Motto, [place].   I, Jarold Motto, [place], have reviewed all documentation for this visit. The documentation on 10/23/22 for the exam, diagnosis, procedures, and orders are all accurate and complete.    Jarold Motto, PA-C

## 2022-11-17 ENCOUNTER — Other Ambulatory Visit: Payer: Self-pay | Admitting: Physician Assistant

## 2022-11-18 LAB — TESTOSTERONE: Testosterone: 51.64

## 2022-11-18 LAB — VITAMIN D 25 HYDROXY (VIT D DEFICIENCY, FRACTURES): Vit D, 25-Hydroxy: 9.69

## 2022-11-18 LAB — TSH: TSH: 2.87 (ref 0.41–5.90)

## 2022-11-27 IMAGING — MR MR ABDOMEN WO/W CM
19 series · 48 of 48 positions shown · IV contrast (20 ml multihance)
Comparison: 07/10/2021

CLINICAL DATA: Hepatic lesions for further characterization.

EXAM:
MRI ABDOMEN WITHOUT AND WITH CONTRAST
TECHNIQUE: Multiplanar multisequence MR imaging of the abdomen was performed
both before and after the administration of intravenous contrast.
CONTRAST:  20mL MULTIHANCE GADOBENATE DIMEGLUMINE 529 MG/ML IV SOLN

[Series 4: T2 · coronal · 5.0mm · 1.56mm/px · 1 of 38 slices shown (1 of 3)]
[im 1/38]
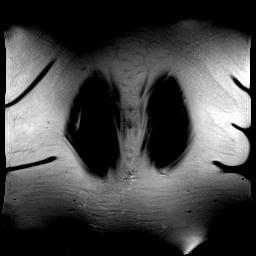

[Series 5: T1 · axial · 3.0mm · 1.25mm/px · z∈[+48,+285]mm · 4 of 160 slices shown]
[im 1/160]
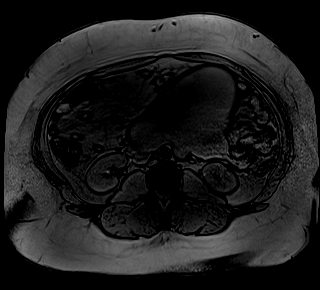
[im 54/160]
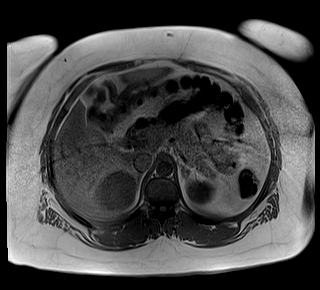
[im 107/160]
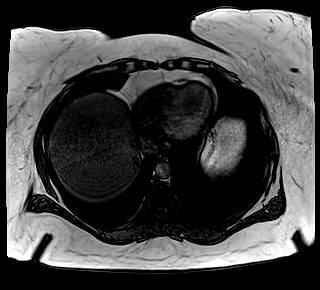
[im 160/160]
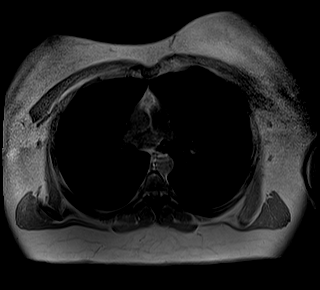

[Series 6: bSSFP · axial · 5.0mm · 1.25mm/px · 1 of 41 slices shown]
[im 1/41]
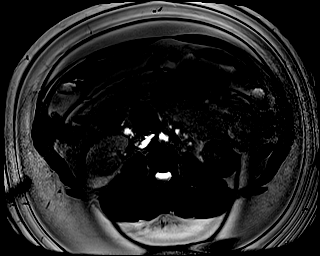

[Series 7: T2 · axial · 5.0mm · 1.56mm/px · 1 of 44 slices shown (2 of 3)]
[im 1/44]
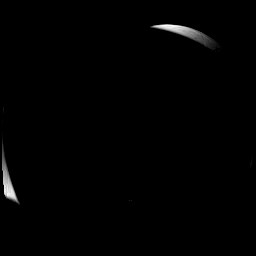

[Series 8: DWI · axial · 5.0mm · 1.49mm/px · z∈[+70,+316]mm · 3 of 126 slices shown (1 of 2)]
[im 1/126]
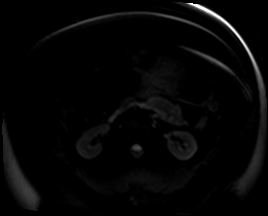
[im 63/126]
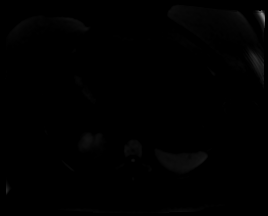
[im 126/126]
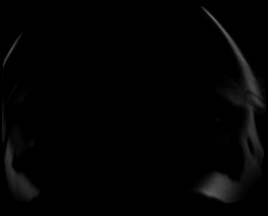

[Series 9: DWI · axial · 5.0mm · 1.49mm/px · 1 of 42 slices shown (2 of 2)]
[im 1/42]
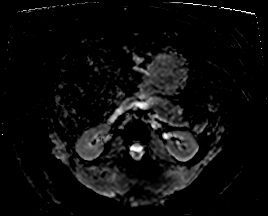

[Series 10: T2 · axial · 6.0mm · 1.25mm/px · 1 of 35 slices shown (3 of 3)]
[im 1/35]
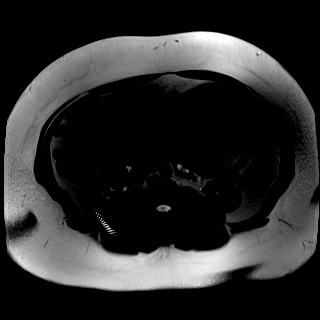

[Series 11: T1 dynamic · axial · non-contrast · 3.0mm · 1.25mm/px · z∈[+36,+297]mm · 3 of 88 slices shown]
[im 1/88]
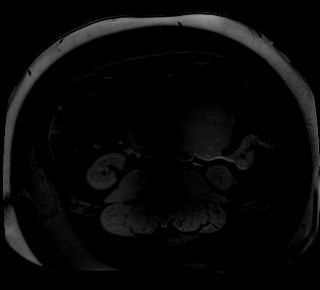
[im 44/88]
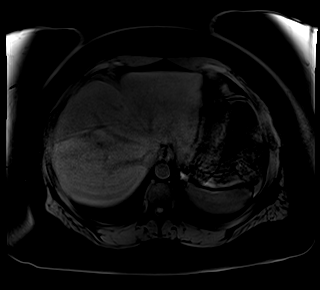
[im 88/88]
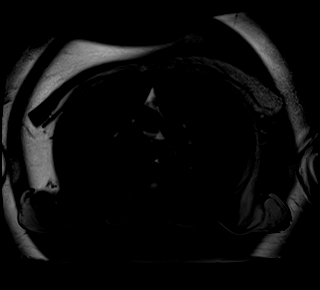

[Series 12: T1 dynamic post-contrast · axial · 3.0mm · 1.25mm/px · z∈[+36,+297]mm · 3 of 88 slices shown (1 of 11)]
[im 1/88]
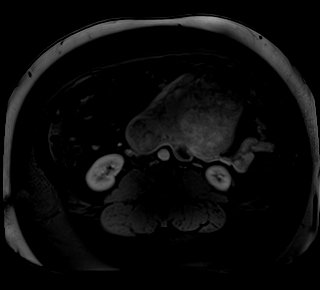
[im 44/88]
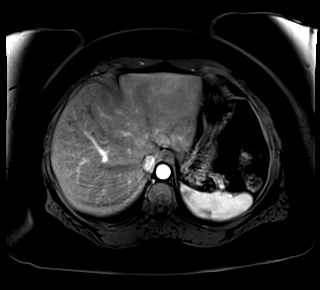
[im 88/88]
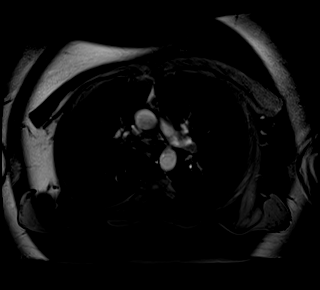

[Series 13: T1 dynamic post-contrast · axial · 3.0mm · 1.25mm/px · z∈[+36,+297]mm · 3 of 88 slices shown (2 of 11)]
[im 1/88]
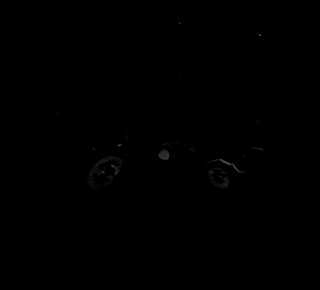
[im 44/88]
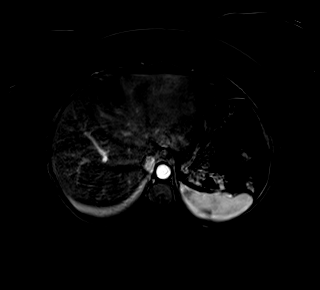
[im 88/88]
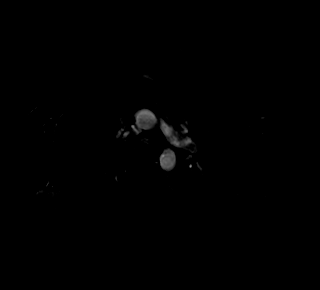

[Series 14: T1 dynamic post-contrast · axial · 3.0mm · 1.25mm/px · z∈[+36,+297]mm · 3 of 88 slices shown (3 of 11)]
[im 1/88]
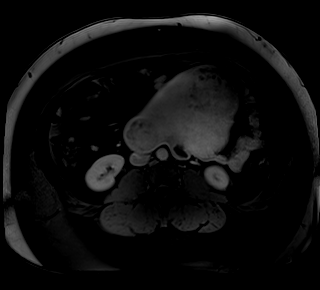
[im 44/88]
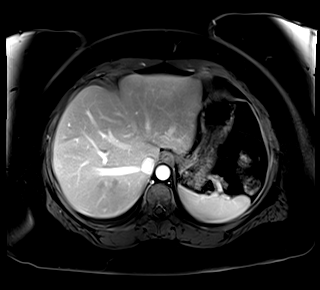
[im 88/88]
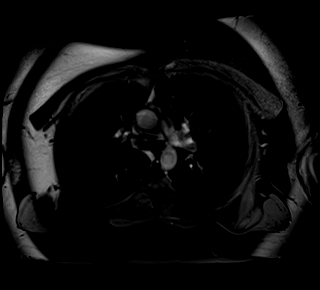

[Series 15: T1 dynamic post-contrast · axial · 3.0mm · 1.25mm/px · z∈[+36,+297]mm · 3 of 88 slices shown (4 of 11)]
[im 1/88]
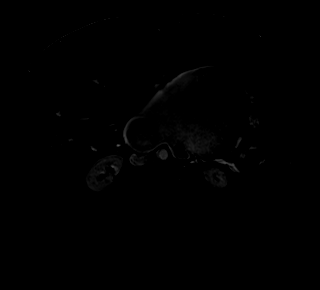
[im 44/88]
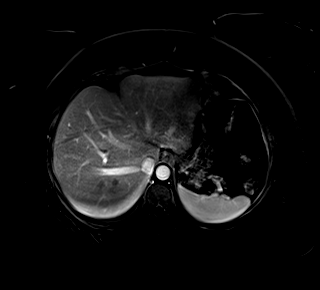
[im 88/88]
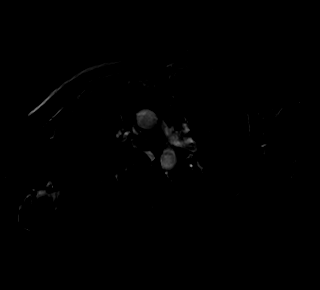

[Series 16: T1 dynamic post-contrast · axial · 3.0mm · 1.25mm/px · z∈[+36,+297]mm · 3 of 88 slices shown (5 of 11)]
[im 1/88]
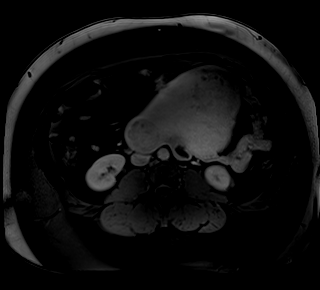
[im 44/88]
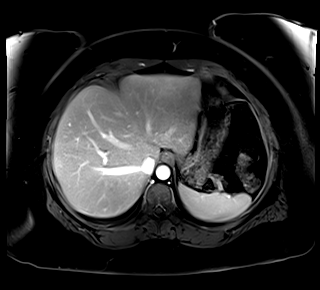
[im 88/88]
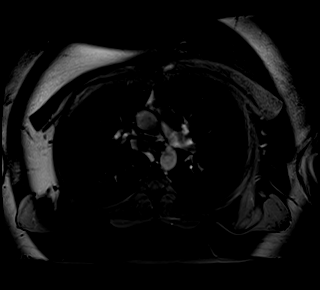

[Series 17: T1 dynamic post-contrast · axial · 3.0mm · 1.25mm/px · z∈[+36,+297]mm · 3 of 88 slices shown (6 of 11)]
[im 1/88]
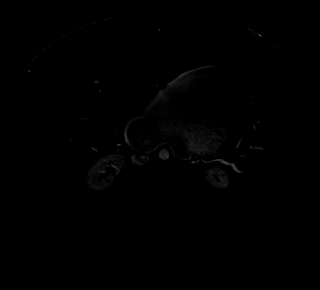
[im 44/88]
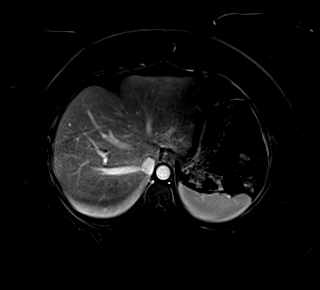
[im 88/88]
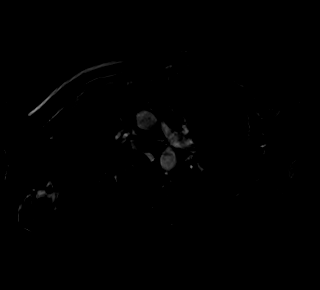

[Series 18: T1 dynamic post-contrast · coronal · 3.0mm · 1.25mm/px · 3 of 88 slices shown (7 of 11)]
[im 1/88]
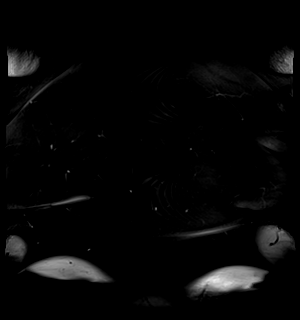
[im 44/88]
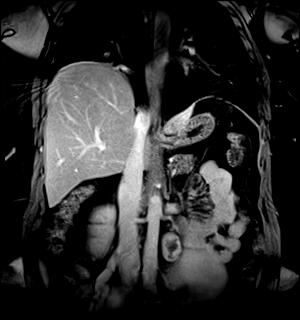
[im 88/88]
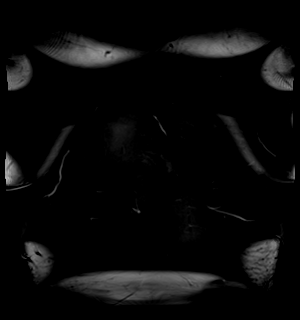

[Series 19: T1 dynamic post-contrast · axial · 3.0mm · 1.25mm/px · z∈[+36,+297]mm · 3 of 88 slices shown (8 of 11)]
[im 1/88]
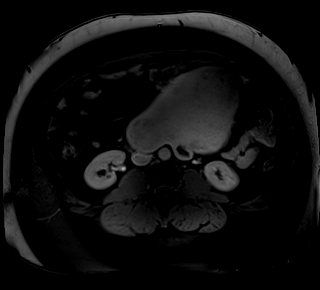
[im 44/88]
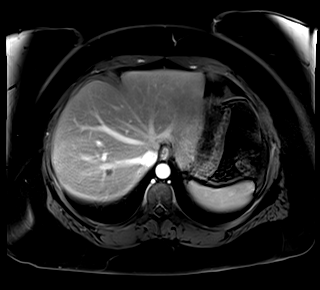
[im 88/88]
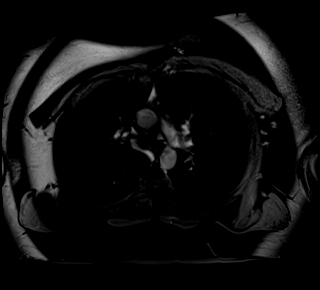

[Series 20: T1 dynamic post-contrast · axial · 3.0mm · 1.25mm/px · z∈[+36,+297]mm · 3 of 88 slices shown (9 of 11)]
[im 1/88]
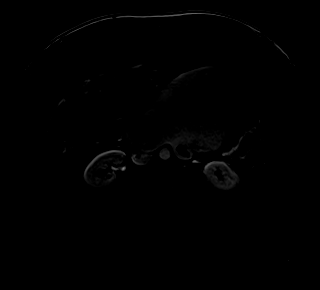
[im 44/88]
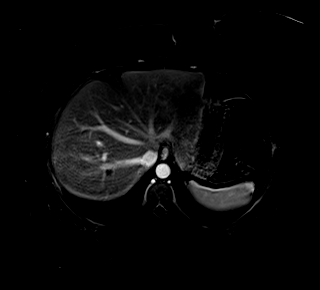
[im 88/88]
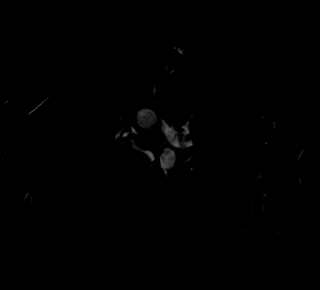

[Series 21: T1 dynamic post-contrast · axial · delayed · 3.0mm · 1.25mm/px · z∈[+36,+297]mm · 3 of 88 slices shown (10 of 11)]
[im 1/88]
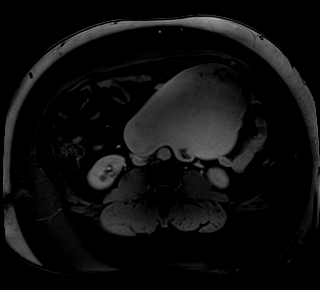
[im 44/88]
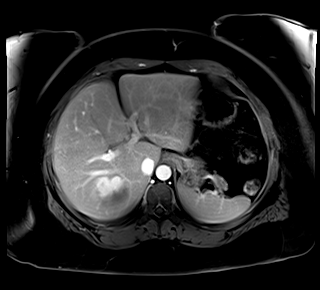
[im 88/88]
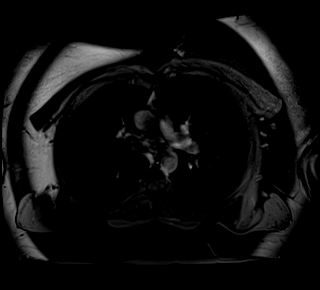

[Series 22: T1 dynamic post-contrast · axial · 3.0mm · 1.25mm/px · z∈[+36,+297]mm · 3 of 88 slices shown (11 of 11)]
[im 1/88]
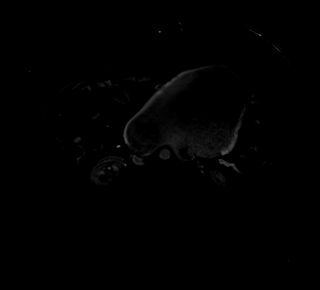
[im 44/88]
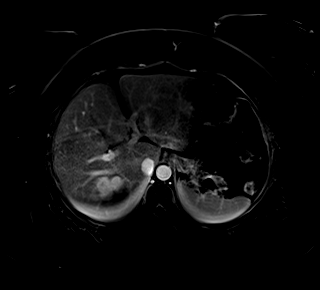
[im 88/88]
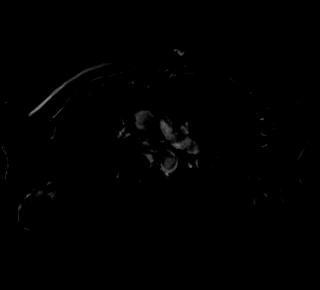

[48 of 48 positions shown; findings below may reference images not displayed]

FINDINGS: Lower chest: Mildly elevated right hemidiaphragm.

Hepatobiliary: Mild hepatic steatosis.

6.4 by 4.9 by 5.4 cm (volume = 89 cm^3)hemangioma in segment 6 of
the liver shown on image 11 series 4 with expected high T2 signal
and peripheral discontinuous nodular centripetal enhancement. By my
measurements and when measured in the same fashion as on today's
exam, this lesion previously measured 6.7 by 5.3 by 5.2 cm (volume =
97 cm^3).

Just cephalad to this lesion on image 18 series 7 there is a 0.7 cm
T2 signal hyperintensity which demonstrates peripheral nodular
enhancement on the delayed images also compatible with a small
hemangioma, present although somewhat obscured by motion artifact on
the prior exam.

No biliary dilatation.  Gallbladder unremarkable.

Pancreas:  Unremarkable

Spleen:  Unremarkable

Adrenals/Urinary Tract: Stable tiny benign left kidney upper pole
angiomyolipoma shown on image 63 of series [DATE], this is about 6 mm
in diameter. Adrenal glands unremarkable.

Stomach/Bowel: Unremarkable

Vascular/Lymphatic:  Unremarkable

Other: Prominently enlarged uterus with large uterine fibroids. The
uterus extends cephalad to the level of the iliac crests.

Musculoskeletal: Unremarkable
IMPRESSION: 1. Two benign hepatic hemangiomas are present, the larger measures
up to 6.4 cm in long axis and is approximately 89 cc in volume. A
portion of this larger hemangioma abuts the capsular margin of
segment 6.
2. Very small left kidney upper pole angiomyolipoma.
3. Mildly elevated right hemidiaphragm.
4. Substantially prominent fibroid uterus.

## 2022-11-28 ENCOUNTER — Encounter: Payer: Self-pay | Admitting: Physician Assistant

## 2022-11-28 NOTE — Telephone Encounter (Signed)
Please see pt msg and concerns and advise 

## 2022-12-03 ENCOUNTER — Ambulatory Visit: Payer: No Typology Code available for payment source | Admitting: Physician Assistant

## 2022-12-03 VITALS — BP 128/76 | Temp 97.6°F | Ht 69.0 in | Wt 297.4 lb

## 2022-12-03 DIAGNOSIS — R030 Elevated blood-pressure reading, without diagnosis of hypertension: Secondary | ICD-10-CM | POA: Diagnosis not present

## 2022-12-03 DIAGNOSIS — E669 Obesity, unspecified: Secondary | ICD-10-CM | POA: Diagnosis not present

## 2022-12-03 DIAGNOSIS — Z6841 Body Mass Index (BMI) 40.0 and over, adult: Secondary | ICD-10-CM

## 2022-12-03 DIAGNOSIS — E282 Polycystic ovarian syndrome: Secondary | ICD-10-CM

## 2022-12-03 DIAGNOSIS — E559 Vitamin D deficiency, unspecified: Secondary | ICD-10-CM

## 2022-12-03 DIAGNOSIS — F419 Anxiety disorder, unspecified: Secondary | ICD-10-CM

## 2022-12-03 DIAGNOSIS — F32A Depression, unspecified: Secondary | ICD-10-CM

## 2022-12-03 NOTE — Patient Instructions (Signed)
It was great to see you!  I recommend starting Ozempic 0.25 mg weekly  You can check in with me in about 1 month to see how this is doing and we can advise on next steps  If you end up starting metformin, please only start 500 mg daily!  I'll be in touch if I have any different recommendations once I get your records from the fertility office.  Take care,  Jarold Motto PA-C

## 2022-12-03 NOTE — Progress Notes (Signed)
Tara Fox is a 32 y.o. female here for a new problem.  History of Present Illness:   Chief Complaint  Patient presents with   Medication Consultation    Pt notes saw specialist and was dx with PCOS and was given Metformin and Ozempic at the same time wants to ensure this is safe     HPI  Medication Consultation: She recently saw a specialist at [facility].  She was prescribed 0.25 Ozempic weekly for weight loss prior to her fibroid surgery as well as 1000 mg Metformin twice daily. She has not started Ozempic or Metformin yet, she wanted to wait until after this consult for more information. Her fibroid surgery has not yet been scheduled. Plans to follow up with OBGYN soon.   Vitamin D deficiency She reports her lab work reflected a vitamin deficiency for which she was advised to take 5000 units daily.   Mood: She is compliant with 25 mg Zoloft daily and is tolerating it well.   Hypertension: She is compliant with 2.5 mg Amlodipine daily. She monitors her blood pressure at home with a wrist cuff, reports a reading of 150/100 yesterday.  Denies any leg or ankle swelling.  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 Not weekly   Drug use: Never    Past Surgical History:  Procedure Laterality Date   HERNIA REPAIR  1988    Family History  Problem Relation Age of Onset   Diabetes Mother    Hypertension Mother    Alcohol abuse Father     Hypertension Father    Sickle cell anemia Brother    Stroke Brother        x 3    Cancer Maternal Aunt        breast CA, 2 aunts   Diabetes Maternal Aunt    Colon cancer Maternal Grandmother 27   Dementia Paternal Grandmother     Allergies  Allergen Reactions   Dust Mite Extract     Current Medications:   Current Outpatient Medications:    amLODipine (NORVASC) 2.5 MG tablet, TAKE 1 TABLET BY MOUTH EVERY DAY, Disp: 90 tablet, Rfl: 1   busPIRone (BUSPAR) 5 MG tablet, Take 1 tablet (5 mg total) by mouth 3 (three) times daily as needed., Disp: 30 tablet, Rfl: 0   Ferrous Sulfate (IRON PO), Take by mouth daily., Disp: , Rfl:    lisdexamfetamine (VYVANSE) 40 MG capsule, Take 40 mg by mouth daily., Disp: , Rfl:    metFORMIN (GLUCOPHAGE) 1000 MG tablet, Take 1,000 mg by mouth 2 (two) times daily., Disp: , Rfl:    Multiple Vitamins-Minerals (B COMPLEX-C-E-ZINC) tablet, Take 1 tablet by mouth daily., Disp: , Rfl:    OZEMPIC, 0.25 OR 0.5 MG/DOSE, 2 MG/3ML SOPN, Inject into the skin., Disp: , Rfl:    sertraline (ZOLOFT)  25 MG tablet, TAKE 1 TABLET (25 MG TOTAL) BY MOUTH DAILY., Disp: 90 tablet, Rfl: 1   cetirizine (ZYRTEC) 10 MG tablet, TAKE 1 TABLET BY MOUTH ONCE A DAY, Disp: 30 tablet, Rfl: 11   Review of Systems:   ROS Negative unless otherwise specified per HPI.  Vitals:   Vitals:   12/03/22 0829  BP: 128/76  Temp: 97.6 F (36.4 C)  TempSrc: Temporal  Weight: 297 lb 6.4 oz (134.9 kg)  Height: 5\' 9"  (1.753 m)     Body mass index is 43.92 kg/m.  Physical Exam:   Physical Exam Vitals and nursing note reviewed.  Constitutional:      General: She is not in acute distress.    Appearance: She is well-developed. She is not ill-appearing or toxic-appearing.  Cardiovascular:     Rate and Rhythm: Normal rate and regular rhythm.     Pulses: Normal pulses.     Heart sounds: Normal heart sounds, S1 normal and S2 normal.  Pulmonary:     Effort: Pulmonary effort is normal.      Breath sounds: Normal breath sounds.  Skin:    General: Skin is warm and dry.  Neurological:     Mental Status: She is alert.     GCS: GCS eye subscore is 4. GCS verbal subscore is 5. GCS motor subscore is 6.  Psychiatric:        Speech: Speech normal.        Behavior: Behavior normal. Behavior is cooperative.     Assessment and Plan:   Obesity, unspecified classification, unspecified obesity type, unspecified whether serious comorbidity present; PCOS (polycystic ovarian syndrome) Reviewed medications that were [facility] by [facility] Recommend starting 0.25 mg weekly Ozempic  After 1 month, reach out to me and we can discuss if we want to increase this or maintain - -reach out sooner if concerns May also end up starting Metformin prior to pregnancy Discussed that Ozempic is not safe for pregnancy -- recommendation is to stop this for at least 2 months prior to surgery I did request note/labs from this provider to see if there is any additional information that we are missing to determine best medication regimen for her  Vitamin D deficiency Take weekly supplement Encouraged her to take this with food  Elevated blood pressure reading Normotensive Continue amlodipine 2.5 mg daily Recommend patient monitor home blood pressure at least a few times weekly If home monitoring shows consistent elevation, or any symptom(s) develop, recommend reach out to [place] for further advice on next steps We did discuss that amlodipine is not medication of choice for pregnancy and HTN  Anxiety and depression Doing well with Zoloft 25 mg daily Denies SI/HI Continue to monitor at each visit  I,Rachel Rivera,acting as a scribe for [employer], PA.,have documented all relevant documentation on the behalf of Jarold Motto, PA,as directed by  Jarold Motto, PA while in the presence of Jarold Motto, [place].  I, Jarold Motto, [place], have reviewed all documentation for this visit. The  documentation on 12/03/22 for the exam, diagnosis, procedures, and orders are all accurate and complete.   Jarold Motto, PA-C

## 2022-12-20 ENCOUNTER — Encounter: Payer: Self-pay | Admitting: Obstetrics and Gynecology

## 2022-12-23 ENCOUNTER — Other Ambulatory Visit: Payer: Self-pay | Admitting: Obstetrics and Gynecology

## 2022-12-23 DIAGNOSIS — D259 Leiomyoma of uterus, unspecified: Secondary | ICD-10-CM

## 2023-01-02 ENCOUNTER — Encounter: Payer: Self-pay | Admitting: Obstetrics and Gynecology

## 2023-01-23 ENCOUNTER — Ambulatory Visit: Payer: No Typology Code available for payment source | Admitting: Physician Assistant

## 2023-01-27 ENCOUNTER — Encounter: Payer: Self-pay | Admitting: Physician Assistant

## 2023-01-27 ENCOUNTER — Ambulatory Visit: Payer: No Typology Code available for payment source | Admitting: Physician Assistant

## 2023-01-27 VITALS — BP 130/96 | HR 101 | Ht 69.0 in | Wt 294.2 lb

## 2023-01-27 DIAGNOSIS — R030 Elevated blood-pressure reading, without diagnosis of hypertension: Secondary | ICD-10-CM | POA: Diagnosis not present

## 2023-01-27 DIAGNOSIS — F32A Depression, unspecified: Secondary | ICD-10-CM | POA: Diagnosis not present

## 2023-01-27 DIAGNOSIS — E282 Polycystic ovarian syndrome: Secondary | ICD-10-CM | POA: Diagnosis not present

## 2023-01-27 DIAGNOSIS — F419 Anxiety disorder, unspecified: Secondary | ICD-10-CM | POA: Diagnosis not present

## 2023-01-27 MED ORDER — METFORMIN HCL 500 MG/5ML PO SOLN: 500.0000 mg | Freq: Every day | ORAL | 1 refills | Status: DC

## 2023-01-27 NOTE — Progress Notes (Signed)
Tara Fox is a 32 y.o. female here for a follow up of a pre-existing problem.  History of Present Illness:   Chief Complaint  Patient presents with   Medical Management of Chronic Issues    Pt here for f/u on Hypertension, Anxiety & Depression    HPI  PCOS She has not started Metformin yet as she's unable to swallow it.   She took Ozempic 0.25 mg once but experienced constipation with it thus she has not taking it since.   Anxiety and Depression: She is compliant with 25 mg Zoloft daily and is tolerating it well.  She also doesn't take it over the weekend.   Hypertension: She is compliant with 2.5 mg Amlodipine daily. She states that she usually doesn't take it over the weekend.  She has not been checking her blood pressure as often.  Her last at home blood pressure checking was 131/83.   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 Not weekly   Drug use: Never    Past Surgical History:  Procedure Laterality Date   HERNIA REPAIR  1988    Family History  Problem Relation Age of Onset   Diabetes Mother    Hypertension Mother    Alcohol abuse Father    Hypertension Father    Sickle cell anemia Brother    Stroke Brother        x 3    Cancer Maternal Aunt        breast CA, 2 aunts   Diabetes Maternal Aunt    Colon cancer Maternal Grandmother 38   Dementia Paternal Grandmother     Allergies  Allergen Reactions   Dust Mite Extract     Current Medications:   Current Outpatient Medications:    amLODipine (NORVASC) 2.5 MG tablet, TAKE 1 TABLET BY MOUTH  EVERY DAY, Disp: 90 tablet, Rfl: 1   busPIRone (BUSPAR) 5 MG tablet, Take 1 tablet (5 mg total) by mouth 3 (three) times daily as needed., Disp: 30 tablet, Rfl: 0   Cholecalciferol (VITAMIN D3) 1.25 MG (50000 UT) CAPS, Take 1 capsule by mouth once a week., Disp: , Rfl:    Ferrous Sulfate (IRON PO), Take by mouth daily., Disp: , Rfl:    lisdexamfetamine (VYVANSE) 40 MG capsule, Take 40 mg by mouth daily., Disp: , Rfl:    Metformin HCl 500 MG/5ML SOLN, Take 5 mLs (500 mg total) by mouth daily., Disp: 473 mL, Rfl: 1   Multiple Vitamins-Minerals (B COMPLEX-C-E-ZINC) tablet, Take 1 tablet by mouth daily., Disp: , Rfl:    OZEMPIC, 0.25 OR 0.5 MG/DOSE, 2 MG/3ML SOPN, Inject 0.25 mg into the skin once a week., Disp: , Rfl:    sertraline (ZOLOFT) 25 MG tablet, TAKE 1 TABLET (25 MG TOTAL) BY MOUTH DAILY., Disp: 90 tablet, Rfl: 1   cetirizine (ZYRTEC) 10 MG tablet, TAKE 1 TABLET BY MOUTH ONCE A DAY, Disp: 30 tablet, Rfl: 11   Review of  Systems:   ROS Negative unless otherwise specified per HPI.  Vitals:   Vitals:   01/27/23 0852 01/27/23 0913  BP: (!) 136/90 (!) 130/96  Pulse: (!) 101   SpO2: 98%   Weight: 294 lb 4 oz (133.5 kg)   Height: 5\' 9"  (1.753 m)      Body mass index is 43.45 kg/m.  Physical Exam:   Physical Exam Vitals and nursing note reviewed.  Constitutional:      General: She is not in acute distress.    Appearance: She is well-developed. She is not ill-appearing or toxic-appearing.  Cardiovascular:     Rate and Rhythm: Normal rate and regular rhythm.     Pulses: Normal pulses.     Heart sounds: Normal heart sounds, S1 normal and S2 normal.  Pulmonary:     Effort: Pulmonary effort is normal.     Breath sounds: Normal breath sounds.  Skin:    General: Skin is warm and dry.  Neurological:     Mental Status: She is alert.     GCS: GCS eye subscore is 4. GCS verbal subscore is 5. GCS motor subscore is 6.  Psychiatric:        Speech: Speech normal.        Behavior:  Behavior normal. Behavior is cooperative.     Assessment and Plan:   Elevated blood pressure reading Above goal today No evidence of end-organ damage on my exam Recommend patient monitor home blood pressure at least a few times weekly Continue amlodipine 2.5 mg daily -- encouraged her to take daily If home monitoring shows consistent elevation, or any symptom(s) develop, recommend reach out to [place] for further advice on next steps   PCOS (polycystic ovarian syndrome) Ongoing Trial liquid metformin 500 mg daily Follow-up if unable to tolerate this Consider restarting Ozempic with bowel regimen Follow-up in 6 month(s), sooner if concerns   Anxiety and depression Well controlled Continue Zoloft 25 mg daily Denies SI/HI Follow-up in 6 month(s), sooner if concerns   I,Safa M Kadhim,acting as a scribe for [employer], PA.,have documented all relevant documentation on the behalf of Jarold Motto, PA,as directed by  Jarold Motto, PA while in the presence of Jarold Motto, [place].   I, Jarold Motto, [place], have reviewed all documentation for this visit. The documentation on 01/27/23 for the exam, diagnosis, procedures, and orders are all accurate and complete.   Jarold Motto, PA-C

## 2023-01-27 NOTE — Patient Instructions (Signed)
It was great to see you!  Hold Ozempic for now  Trial the 500 mg oral liquid metformin and see how you do with this -- message me with how this goes  May consider re-trialing the Ozempic   Check your blood pressure at home later today if still elevated and we will document this reading for you.  Let's follow-up in 6 months, sooner if you have concerns.  Take care,  Jarold Motto PA-C

## 2023-01-30 ENCOUNTER — Encounter: Payer: Self-pay | Admitting: Obstetrics and Gynecology

## 2023-02-04 ENCOUNTER — Ambulatory Visit
Admission: RE | Admit: 2023-02-04 | Discharge: 2023-02-04 | Disposition: A | Discharge status: Home / Self Care | Payer: No Typology Code available for payment source | Source: Ambulatory Visit | Attending: Obstetrics and Gynecology | Admitting: Obstetrics and Gynecology

## 2023-02-04 DIAGNOSIS — D259 Leiomyoma of uterus, unspecified: Secondary | ICD-10-CM

## 2023-02-04 MED ORDER — GADOPICLENOL 0.5 MMOL/ML IV SOLN
10.0000 mL | Freq: Once | INTRAVENOUS | Status: AC | PRN
  Administered 2023-02-04: 10 mL via INTRAVENOUS

## 2023-03-13 ENCOUNTER — Other Ambulatory Visit: Payer: Self-pay | Admitting: Obstetrics and Gynecology

## 2023-03-13 DIAGNOSIS — D259 Leiomyoma of uterus, unspecified: Secondary | ICD-10-CM

## 2023-03-25 ENCOUNTER — Other Ambulatory Visit: Payer: Self-pay | Admitting: Interventional Radiology

## 2023-03-25 ENCOUNTER — Ambulatory Visit
Admission: RE | Admit: 2023-03-25 | Discharge: 2023-03-25 | Disposition: A | Discharge status: Home / Self Care | Payer: No Typology Code available for payment source | Source: Ambulatory Visit | Attending: Obstetrics and Gynecology | Admitting: Obstetrics and Gynecology

## 2023-03-25 DIAGNOSIS — D259 Leiomyoma of uterus, unspecified: Secondary | ICD-10-CM

## 2023-03-25 HISTORY — PX: IR RADIOLOGIST EVAL & MGMT: IMG5224

## 2023-03-25 NOTE — Consult Note (Signed)
Chief Complaint: Patient was seen in consultation today for uterine fibroids at the request of Tara Fox,Tara Fox  Referring Physician(s): Fermin Schwab  History of Present Illness: Tara Fox is a 32 y.o. female (G0, P0) who presents at the kind request of Dr. Maxie Better to discuss preoperative embolization of her uterine fibroids prior to undergoing elective uterine myomectomy.  She has a history of uterine fibroids diagnosed in January 2023.  Her fibroids are extremely large.  Her uterus is nearly 30 cm in maximal dimension and is measured at approximately 2065 g.  She has had difficulty with fertility due to the large size of her fibroids.  She is interested in having children and wants to preserve future fertility.  Her symptoms include pelvic heaviness, bulk related symptoms, cramping and menorrhagia resulting in anemia.  Her most recent hemoglobin is 11.5.  She is currently on iron supplementation.  Dr. Cherly Hensen would like to proceed with myomectomy later this year.  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 Authorizing Provider  amLODipine (NORVASC) 2.5 MG tablet TAKE 1 TABLET BY MOUTH EVERY DAY 11/18/22  Yes Jarold Motto, PA  busPIRone (BUSPAR) 5 MG tablet Take 1 tablet (5 mg total) by mouth 3 (three) times daily as needed. 08/02/22  Yes Jarold Motto, PA  Cholecalciferol (VITAMIN D3) 1.25 MG (50000 UT) CAPS Take 1 capsule by mouth once a week. 11/25/22  Yes [provider]  Ferrous Sulfate (IRON PO) Take by mouth daily.   Yes [provider]  Serdexmethylphen-Dexmethylphen (AZSTARYS) 52.3-10.4 MG CAPS Take 1 capsule by mouth once.    Yes [provider]  sertraline (ZOLOFT) 25 MG tablet TAKE 1 TABLET (25 MG TOTAL) BY MOUTH DAILY. 10/17/22  Yes Worley, Lelon Mast, PA  cetirizine (ZYRTEC) 10 MG tablet TAKE 1 TABLET BY MOUTH ONCE A DAY 08/30/20 08/30/21  Jannifer Rodney A, FNP  lisdexamfetamine (VYVANSE) 40 MG capsule Take 40 mg by mouth daily. Patient not taking: Reported on 03/25/2023 11/12/22   [provider]  Metformin HCl 500 MG/5ML SOLN Take 5 mLs (500 mg total) by mouth daily. Patient not taking: Reported on 03/25/2023 01/27/23   Jarold Motto, PA  Multiple Vitamins-Minerals (B COMPLEX-C-E-ZINC) tablet Take 1 tablet by mouth daily. Patient not taking: Reported on 03/25/2023    [provider]  OZEMPIC, 0.25 OR 0.5 MG/DOSE, 2 MG/3ML SOPN Inject 0.25 mg into the skin once a week. Patient not taking: Reported on 03/25/2023 11/25/22   [provider]     Family History  Problem Relation Age of Onset   Diabetes Mother    Hypertension Mother    Alcohol abuse Father    Hypertension Father    Sickle cell anemia Brother    Stroke Brother        x 3    Cancer Maternal Aunt        breast CA, 2 aunts   Diabetes Maternal Aunt    Colon cancer Maternal Grandmother 36   Dementia Paternal Grandmother     Social History   Socioeconomic History   Marital status: Single    Spouse name: Not on  file   Number of children: Not on file   Years of education: Not on file   Highest education level: Bachelor's degree (e.g., BA, AB, BS)  Occupational History   Not on file  Tobacco Use   Smoking status: Never   Smokeless tobacco: Never  Vaping Use   Vaping status: Never Used  Substance and Sexual Activity   Alcohol use: Yes    Alcohol/week: 3.0 standard drinks of alcohol    Types: 1 Glasses of wine, 2 Shots of liquor per week    Comment: Occasional. 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   Social Determinants of Health   Financial Resource Strain: Low Risk  (10/22/2022)   Overall Financial Resource Strain (CARDIA)    Difficulty of Paying Living Expenses: Not hard at all  Food Insecurity: No Food Insecurity (10/22/2022)   Hunger Vital Sign    Worried About Running Out of Food in the Last Year: Never true    Ran Out of Food in the Last Year: Never true  Transportation Needs: No Transportation Needs (10/22/2022)   PRAPARE - Administrator, Civil Service (Medical): No    Lack of Transportation (Non-Medical): No  Physical Activity: Unknown (10/22/2022)   Exercise Vital Sign    Days of Exercise per Week: 0 days    Minutes of Exercise per Session: Not on file  Stress: No Stress Concern Present (10/22/2022)   [employer] of Occupational Health - Occupational Stress Questionnaire    Feeling of Stress : Not at all  Social Connections: Unknown (10/22/2022)   Social Connection and Isolation Panel [NHANES]    Frequency of Communication with Friends and Family: Once a week    Frequency of Social Gatherings with Friends and Family: Not on file    Attends Religious Services: Never    Database administrator or Organizations: No    Attends Engineer, structural: Not on file    Marital Status: Never married    Review of Systems: A 12 point ROS discussed and pertinent positives are indicated in the HPI above.  All other systems are negative.  Review of Systems  Vital Signs: BP (!) 156/105 (BP Location: Left Arm, Patient Position: Sitting, Cuff Size: Normal)   Pulse (!) 104   Temp 97.9 F (36.6 C) (Oral)   Resp 14   Ht 5\' 9"  (1.753 m)   SpO2 97%   BMI 43.45 kg/m     Physical Exam Constitutional:      Appearance: Normal appearance. She is obese.  HENT:     Head: Normocephalic and atraumatic.  Eyes:      General: No scleral icterus. Cardiovascular:     Rate and Rhythm: Normal rate.  Pulmonary:     Effort: Pulmonary effort is normal.  Abdominal:     General: There is no distension.     Palpations: Abdomen is soft. There is mass.     Tenderness: There is no abdominal tenderness.  Skin:    General: Skin is warm and dry.  Neurological:     Mental Status: She is alert and oriented to person, place, and time.  Psychiatric:        Mood and Affect: Mood normal.        Behavior: Behavior normal.       Imaging: No results found.  Labs:  CBC: Recent Labs    09/24/22 0926  WBC 5.0  HGB 11.5*  HCT 36.7  PLT 315.0    COAGS: No results for input(s): "INR", "APTT" in the last 8760 hours.  BMP: Recent Labs    09/24/22 0926  NA 140  K 4.1  CL 107  CO2 25  GLUCOSE 104*  BUN 9  CALCIUM 9.2  CREATININE 0.97    LIVER FUNCTION TESTS: Recent Labs    09/24/22 0926  BILITOT 0.3  AST 12  ALT 8  ALKPHOS 57  PROT 6.8  ALBUMIN 3.8    TUMOR MARKERS: No results for input(s): "AFPTM", "CEA", "CA199", "CHROMGRNA" in the last 8760 hours.  Assessment and Plan:  Very pleasant 32 year old female with highly symptomatic and massively enlarged multifibroid uterus.  Her uterus is approximately 2065 g and equivalent to a 65-month gravid uterus.  She desires future fertility and her OB/GYN, Dr. Cherly Hensen, is planning for elective myomectomy later this year.  She is a candidate for preoperative uterine artery embolization to minimize blood loss and complications during the myomectomy procedure.  We discussed the natural history of uterine fibroids as well as the risks, benefits and alternatives to preoperative uterine artery embolization.  Mrs. Fusaro understands and is eager to proceed.  I spoke with Dr. Cherly Hensen and she is planning on myomectomy later this year.  Therefore, we will proceed with uterine artery embolization as soon as possible.  1.)  Please schedule for outpatient  superior hypogastric nerve block and uterine artery embolization.     Electronically Signed: Sterling Big 03/25/2023, 11:17 AM   I spent a total of  40 Minutes  in face to face in clinical consultation, greater than 50% of which was counseling/coordinating care for uterine fibroids

## 2023-03-26 LAB — CBC
HCT: 38.8 % (ref 35.0–45.0)
Hemoglobin: 11.8 g/dL (ref 11.7–15.5)
MCH: 23.4 pg — ABNORMAL LOW (ref 27.0–33.0)
MCHC: 30.4 g/dL — ABNORMAL LOW (ref 32.0–36.0)
MCV: 76.8 fL — ABNORMAL LOW (ref 80.0–100.0)
MPV: 10.8 fL (ref 7.5–12.5)
Platelets: 477 10*3/uL — ABNORMAL HIGH (ref 140–400)
RBC: 5.05 Million/uL (ref 3.80–5.10)
RDW: 15.7 % — ABNORMAL HIGH (ref 11.0–15.0)
WBC: 6.9 10*3/uL (ref 3.8–10.8)

## 2023-03-26 LAB — COMPLETE METABOLIC PANEL WITH GFR
AG Ratio: 1 (calc) (ref 1.0–2.5)
ALT: 11 U/L (ref 6–29)
AST: 13 U/L (ref 10–30)
Albumin: 3.8 g/dL (ref 3.6–5.1)
Alkaline phosphatase (APISO): 74 U/L (ref 31–125)
BUN/Creatinine Ratio: 7 (calc) (ref 6–22)
BUN: 7 mg/dL (ref 7–25)
CO2: 23 mmol/L (ref 20–32)
Calcium: 9.5 mg/dL (ref 8.6–10.2)
Chloride: 105 mmol/L (ref 98–110)
Creat: 1.02 mg/dL — ABNORMAL HIGH (ref 0.50–0.97)
Globulin: 3.7 g/dL (ref 1.9–3.7)
Glucose, Bld: 101 mg/dL — ABNORMAL HIGH (ref 65–99)
Potassium: 4.2 mmol/L (ref 3.5–5.3)
Sodium: 139 mmol/L (ref 135–146)
Total Bilirubin: 0.3 mg/dL (ref 0.2–1.2)
Total Protein: 7.5 g/dL (ref 6.1–8.1)
eGFR: 74 mL/min/{1.73_m2} (ref >=60)

## 2023-04-23 ENCOUNTER — Telehealth: Payer: Self-pay

## 2023-04-23 MED ORDER — DOCUSATE SODIUM 100 MG PO CAPS: 100.0000 mg | ORAL_CAPSULE | Freq: Two times a day (BID) | ORAL | 0 refills | Status: AC

## 2023-04-23 MED ORDER — ONDANSETRON HCL 8 MG PO TABS: 8.0000 mg | ORAL_TABLET | Freq: Three times a day (TID) | ORAL | 0 refills | Status: DC | PRN

## 2023-04-23 MED ORDER — NAPROXEN 500 MG PO TABS: 500.0000 mg | ORAL_TABLET | Freq: Two times a day (BID) | ORAL | 0 refills | Status: AC

## 2023-04-23 MED ORDER — PROMETHAZINE HCL 12.5 MG PO TABS: 12.5000 mg | ORAL_TABLET | ORAL | 0 refills | Status: DC | PRN

## 2023-04-23 NOTE — Discharge Instructions (Signed)
Uterine Artery Embolization After Care   What can I expect after the procedure?   After the procedure, it is common to have:   Mild pain or discomfort at the arterial entry site   Uterine Cramping   Cramps can vary in strength from what you would consider to be a bad menstrual cycle all the way up to as severe as labor pains.   The cramping is typically the most severe the afternoon and evening the day of the procedure and begin to improve the next day and each day thereafter.   Vaginal bleeding. Your cycle may become irregular the first several months.   Vaginal discharge. We recommend you wear a panty liner for first 4-6 weeks following your procedure.    Nausea and vomiting.      Follow these instructions at home:   Medicines   Take your medicine exactly as told, at the same time every day. This is vital to helping you with a smooth recovery.   Zofran (ondansetron) is used to prevent nausea before it starts.  You will have a prescription to take 8 mg of this medication every 8 hours.  You should take this even if you don't feel nauseated because it is meant to prevent the nausea from occurring.  Once you get nauseated and start to vomit, you may not be able to keep your other medicines down and your pain can be left untreated.  You can take this with every other dose of the oxycodone/acetaminophen   Naproxen is a non-steroidal anti-inflammatory medicine called and is critical in keeping your inflammation and pain under control.  You must take this every 12 hrs. We recommend 8 am and 8 pm.    Percocet (oxycodone/acetaminophen) is a combination narcotic pain medication with Tylenol.  This is to help with your pain.  Take it every 4 hours regardless of your pain level the first 2 days.  Set an alarm to wake up so you don't miss a dose overnight and get behind on your pain control.  After the first 48 hrs, if your pain is minimal you can take only as needed.    Colace (docusate  sodium) is a stool softener to help prevent constipation.  The pain medications often cause constipation which can be particularly uncomfortable after [place].  We recommend you take this at 8 am and 8 pm with your naproxen.    Phenergan (promethazine) is another medication for nausea.  If you still feel sick to your stomach or vomit despite the Zofran (ondansetron) take this medicine every 8 hours as needed.    Incision care   Leave your bandage on for 24 hrs and keep the area dry   You may remove the bandage after 24 hrs and then shower as normal.    Do not submerge in a bath, pool or hot tub until the small incision is completely healed (5-7 days).   Activity   Do not lift anything that is heavier than 5 lb (2.3 kg)for the first 3 days.     Return to your normal activities after day 5.  Take it slowly and listen to your body. Ask your health care provider what activities are safe for you.   General instructions   Many women find a hot water bottle or heating pad helpful when placed on the lower abdomen.  This is fine to do if it helps your cramps.    Do not use any products that contain nicotine or tobacco.   These products include cigarettes, chewing tobacco, and vaping devices, such as e-cigarettes. These can delay incision healing. If you need help quitting, ask your health care provider.   Do not have sex or put anything in your vagina for at least 14 days.    Do not drink alcohol until your health care provider says it is okay.   Keep all follow-up visits. This is important.      Please contact our office at [phone] for the following symptoms:   You have a fever.   You have more redness, swelling, or pain around your incision.   You have more fluid or blood coming from your incision site.   Your incision feels warm to the touch.   You have pus or a bad smell coming from your incision or vagina.   You have a rash.   You have nausea, or you cannot eat or drink  anything without vomiting.   You have a vaginal discharge that is not getting lighter.   Get help right away if:   You have trouble breathing.   You have chest pain.   You have severe pain in your abdomen, and it does not get better with medicine.   You have leg pain or leg swelling.   You feel dizzy, or you faint.   Do not wait to see if the symptoms will go away.   Do not drive yourself to the hospital.      These symptoms may be an emergency.    Get help right away. Call 911.   Summary   After the procedure, it is common to have cramps, or pain or discomfort at the incision site. You will be given pain medicine.   Follow instructions from your health care provider about how to take care of your incision. Check your incision area every day for signs of infection.   Take over-the-counter and prescription medicines only as told by your health care provider.   Contact your health care provider if you have symptoms of infection or other symptoms that do not get better with treatment.   Thanks for visiting [facility] ! 

## 2023-04-23 NOTE — Telephone Encounter (Signed)
Pt. Is to take Naproxen 500 mg twice a day for 7 days. Colace 100 mg twice a day for 7 days. Percocet 7.5/325mg every 4 hours for 5 days for pain. Zofran 8mg every 8 hours for three days and then as needed for nausea/vomiting. Phenergan 12.5 mg every four hours as needed for nausea/vomiting.  

## 2023-04-24 ENCOUNTER — Other Ambulatory Visit: Payer: Self-pay | Admitting: Interventional Radiology

## 2023-04-24 ENCOUNTER — Ambulatory Visit
Admission: RE | Admit: 2023-04-24 | Discharge: 2023-04-24 | Disposition: A | Discharge status: Home / Self Care | Payer: No Typology Code available for payment source | Source: Ambulatory Visit | Attending: Interventional Radiology | Admitting: Interventional Radiology

## 2023-04-24 DIAGNOSIS — D219 Benign neoplasm of connective and other soft tissue, unspecified: Secondary | ICD-10-CM

## 2023-04-24 DIAGNOSIS — D259 Leiomyoma of uterus, unspecified: Secondary | ICD-10-CM

## 2023-04-24 HISTORY — PX: IR EMBO TUMOR ORGAN ISCHEMIA INFARCT INC GUIDE ROADMAPPING: IMG5449

## 2023-04-24 HISTORY — PX: IR FLUORO GUIDED NEEDLE PLC ASPIRATION/INJECTION LOC: IMG2395

## 2023-04-24 MED ORDER — CEFAZOLIN SODIUM-DEXTROSE 2-4 GM/100ML-% IV SOLN: 2.0000 g | INTRAVENOUS | Status: DC

## 2023-04-24 MED ORDER — MIDAZOLAM HCL 2 MG/2ML IJ SOLN: 1.0000 mg | INTRAMUSCULAR | Status: DC | PRN

## 2023-04-24 MED ORDER — FENTANYL CITRATE (PF) 100 MCG/2ML IJ SOLN
INTRAMUSCULAR | Status: DC | PRN
  Administered 2023-04-24 (×2): 50 ug via INTRAVENOUS

## 2023-04-24 MED ORDER — CEFAZOLIN IN SODIUM CHLORIDE 3-0.9 GM/100ML-% IV SOLN
3.0000 g | Freq: Once | INTRAVENOUS | Status: AC
  Administered 2023-04-24: 3 g via INTRAVENOUS

## 2023-04-24 MED ORDER — HYDROMORPHONE HCL 1 MG/ML IJ SOLN
1.0000 mg | Freq: Once | INTRAMUSCULAR | Status: AC
  Administered 2023-04-24: 1 mg via INTRAVENOUS

## 2023-04-24 MED ORDER — PROMETHAZINE HCL 25 MG PO TABS
25.0000 mg | ORAL_TABLET | Freq: Once | ORAL | Status: AC
  Administered 2023-04-24: 25 mg via ORAL

## 2023-04-24 MED ORDER — ONDANSETRON HCL 4 MG/2ML IJ SOLN
8.0000 mg | Freq: Once | INTRAMUSCULAR | Status: AC
  Administered 2023-04-24: 8 mg via INTRAVENOUS

## 2023-04-24 MED ORDER — SODIUM CHLORIDE 0.9 % IV SOLN
10.0000 mg | Freq: Once | INTRAVENOUS | Status: AC
  Administered 2023-04-24: 10 mg via INTRAVENOUS

## 2023-04-24 MED ORDER — FENTANYL CITRATE PF 50 MCG/ML IJ SOSY: 25.0000 ug | PREFILLED_SYRINGE | INTRAMUSCULAR | Status: DC | PRN

## 2023-04-24 MED ORDER — FENTANYL CITRATE (PF) 100 MCG/2ML IJ SOLN
INTRAMUSCULAR | Status: AC | PRN
  Administered 2023-04-24 (×3): 50 ug via INTRAVENOUS

## 2023-04-24 MED ORDER — MIDAZOLAM HCL 2 MG/2ML IJ SOLN
INTRAMUSCULAR | Status: DC | PRN
  Administered 2023-04-24: 1 mg via INTRAVENOUS

## 2023-04-24 MED ORDER — PANTOPRAZOLE SODIUM 40 MG IV SOLR
40.0000 mg | Freq: Once | INTRAVENOUS | Status: AC
  Administered 2023-04-24: 40 mg via INTRAVENOUS

## 2023-04-24 MED ORDER — KETOROLAC TROMETHAMINE 30 MG/ML IJ SOLN
30.0000 mg | Freq: Once | INTRAMUSCULAR | Status: AC
  Administered 2023-04-24: 30 mg via INTRAMUSCULAR

## 2023-04-24 MED ORDER — KETOROLAC TROMETHAMINE 30 MG/ML IJ SOLN
30.0000 mg | Freq: Once | INTRAMUSCULAR | Status: AC
  Administered 2023-04-24: 30 mg via INTRAVENOUS

## 2023-04-24 MED ORDER — MIDAZOLAM HCL 2 MG/2ML IJ SOLN
INTRAMUSCULAR | Status: AC | PRN
  Administered 2023-04-24 (×3): 1 mg via INTRAVENOUS

## 2023-04-24 MED ORDER — ACETAMINOPHEN 10 MG/ML IV SOLN
1000.0000 mg | Freq: Once | INTRAVENOUS | Status: AC
  Administered 2023-04-24: 1000 mg via INTRAVENOUS

## 2023-04-24 MED ORDER — SODIUM CHLORIDE 0.9 % IV SOLN: INTRAVENOUS | Status: DC

## 2023-04-24 NOTE — Progress Notes (Signed)
Pt back in nursing recovery area. Pt still drowsy from procedure but will wake up when spoken to. Pt follows commands, talks in complete sentences and has no complaints at this time. Pt will remain in nursing station until discharge.  ?

## 2023-04-27 ENCOUNTER — Other Ambulatory Visit: Payer: Self-pay | Admitting: Physician Assistant

## 2023-04-30 ENCOUNTER — Encounter: Payer: Self-pay | Admitting: Physician Assistant

## 2023-04-30 ENCOUNTER — Other Ambulatory Visit: Payer: Self-pay | Admitting: Physician Assistant

## 2023-04-30 MED ORDER — AMLODIPINE BESYLATE 5 MG PO TABS: 5.0000 mg | ORAL_TABLET | Freq: Every day | ORAL | 1 refills | Status: DC

## 2023-05-05 ENCOUNTER — Other Ambulatory Visit: Payer: Self-pay | Admitting: Interventional Radiology

## 2023-05-05 ENCOUNTER — Telehealth: Payer: Self-pay

## 2023-05-05 DIAGNOSIS — D250 Submucous leiomyoma of uterus: Secondary | ICD-10-CM

## 2023-05-05 NOTE — Telephone Encounter (Signed)
Phone call to pt to follow up from her [place] on 04/24/23. Pt. Reports that her pain was manageable and the highest it has got between pain medication is a 5 which she has managed with a heating pad. Pt. Reports being able to keep liquids and some solid foods down despite a low appetite. Pt. Reports some vaginal spotting. Pt. Denies any signs of bleeding, infection, redness at the right femoral site, drainage at the site, fever, nausea, or vomiting. Pt has no complaints at this time. Dr. Archer Asa will call her within the week to check on her status post-procedure. Pt advised to call back if anything were to change or any concerns arise and we will arrange an in person appointment. Pt verbalized understanding.

## 2023-05-06 ENCOUNTER — Inpatient Hospital Stay
Admission: RE | Admit: 2023-05-06 | Discharge: 2023-05-06 | Discharge status: Home / Self Care | Payer: No Typology Code available for payment source | Source: Ambulatory Visit | Attending: Interventional Radiology | Admitting: Interventional Radiology

## 2023-05-06 DIAGNOSIS — D250 Submucous leiomyoma of uterus: Secondary | ICD-10-CM

## 2023-05-06 HISTORY — PX: IR RADIOLOGIST EVAL & MGMT: IMG5224

## 2023-05-06 NOTE — Progress Notes (Signed)
Chief Complaint: Patient was seen in consultation today for post [place] evaluation at the request of Tyreque Finken K  Referring Physician(s): Ameerah Huffstetler K  History of Present Illness: Tara Fox is a 32 y.o. female (G0, P0) who presents at the kind request of Dr. Maxie Better to discuss preoperative embolization of her uterine fibroids prior to undergoing elective uterine myomectomy.  She has a history of uterine fibroids diagnosed in January 2023.  Her fibroids are extremely large.  Her uterus is nearly 30 cm in maximal dimension and is measured at approximately 2065 g.   She has had difficulty with fertility due to the large size of her fibroids.  She is interested in having children and wants to preserve future fertility.  Her symptoms include pelvic heaviness, bulk related symptoms, cramping and menorrhagia resulting in anemia.  Her most recent hemoglobin is 11.5.  She is currently on iron supplementation.   Dr. Cherly Hensen would like to proceed with myomectomy later this year.  Ms. Provan underwent [place] on 04/24/23 and presents today for 2 week follow-up.  She is doing great and denies pain, nausea, fever, chills, discharge or other symptoms.  Her puncture site is well-healed.   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 Authorizing Provider  amLODipine (NORVASC) 5 MG tablet Take 1 tablet (5 mg total) by mouth daily. 04/30/23   Jarold Motto, PA  busPIRone (BUSPAR) 5 MG tablet Take 1 tablet (5 mg total) by mouth 3 (three) times daily as needed. 08/02/22   Jarold Motto, PA  cetirizine (ZYRTEC) 10 MG tablet TAKE 1 TABLET BY MOUTH ONCE A DAY 08/30/20 08/30/21  Jannifer Rodney A, FNP  Cholecalciferol (VITAMIN D3) 1.25 MG (50000 UT) CAPS Take 1 capsule by mouth once a week. 11/25/22   [provider]  Ferrous Sulfate (IRON PO) Take by mouth daily.    [provider]  lisdexamfetamine (VYVANSE) 40 MG capsule Take 40 mg by mouth daily. Patient not taking: Reported on 03/25/2023 11/12/22   [provider]  Metformin HCl 500 MG/5ML SOLN Take 5 mLs (500 mg total) by mouth daily. Patient not taking: Reported on 03/25/2023 01/27/23   Jarold Motto, PA  Multiple Vitamins-Minerals (B COMPLEX-C-E-ZINC) tablet Take 1 tablet by mouth daily. Patient not taking: Reported on 03/25/2023    [provider]  ondansetron (ZOFRAN) 8 MG tablet Take 1 tablet (8 mg total) by mouth every 8 (eight) hours as needed for nausea or vomiting. 04/23/23   Sterling Big, MD  OZEMPIC, 0.25 OR 0.5 MG/DOSE, 2 MG/3ML SOPN Inject 0.25 mg into the skin once a week. Patient not taking: Reported on 03/25/2023 11/25/22   [provider]  promethazine (PHENERGAN) 12.5 MG tablet Take 1 tablet (12.5 mg total) by mouth every 4 (four) hours as needed for nausea or vomiting. 04/23/23   Sterling Big, MD  Serdexmethylphen-Dexmethylphen (  AZSTARYS) 52.3-10.4 MG CAPS Take 1 capsule by mouth once.    [provider]  sertraline (ZOLOFT) 25 MG tablet TAKE 1 TABLET (25 MG TOTAL) BY MOUTH DAILY. 04/28/23   Jarold Motto, PA     Family History  Problem Relation Age of Onset   Diabetes Mother    Hypertension Mother    Alcohol abuse Father    Hypertension Father    Sickle cell anemia Brother    Stroke Brother        x 3    Cancer Maternal Aunt        breast CA, 2 aunts   Diabetes Maternal Aunt    Colon cancer  Maternal Grandmother 78   Dementia Paternal Grandmother     Social History   Socioeconomic History   Marital status: Single    Spouse name: Not on file   Number of children: Not on file   Years of education: Not on file   Highest education level: Bachelor's degree (e.g., BA, AB, BS)  Occupational History   Not on file  Tobacco Use   Smoking status: Never   Smokeless tobacco: Never  Vaping Use   Vaping status: Never Used  Substance and Sexual Activity   Alcohol use: Yes    Alcohol/week: 3.0 standard drinks of alcohol    Types: 1 Glasses of wine, 2 Shots of liquor per week    Comment: Occasional. 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   Social Determinants of Health   Financial Resource Strain: Low Risk  (10/22/2022)   Overall Financial Resource Strain (CARDIA)    Difficulty of Paying Living Expenses: Not hard at all  Food Insecurity: No Food Insecurity (10/22/2022)   Hunger Vital Sign    Worried About Running Out of Food in the Last Year: Never true    Ran Out of Food in the Last Year: Never true  Transportation Needs: No Transportation Needs (10/22/2022)   PRAPARE - Administrator, Civil Service (Medical): No    Lack of Transportation (Non-Medical): No  Physical Activity: Unknown (10/22/2022)   Exercise Vital Sign    Days of Exercise per Week: 0 days    Minutes of Exercise per Session: Not on file  Stress: No Stress Concern Present (10/22/2022)   [employer] of Occupational Health - Occupational Stress Questionnaire    Feeling of Stress : Not at all  Social Connections: Unknown (10/22/2022)   Social Connection and Isolation Panel [NHANES]    Frequency of Communication with Friends and Family: Once a week     Frequency of Social Gatherings with Friends and Family: Not on file    Attends Religious Services: Never    Database administrator or Organizations: No    Attends Engineer, structural: Not on file    Marital Status: Never married    Review of Systems: A 12 point ROS discussed and pertinent positives are indicated in the HPI above.  All other systems are negative.  Review of Systems  Vital Signs: There were no vitals taken for this visit.    Physical Exam Constitutional:      General:  She is not in acute distress.    Appearance: Normal appearance. She is obese.  HENT:     Head: Normocephalic and atraumatic.  Eyes:     General: No scleral icterus. Cardiovascular:     Rate and Rhythm: Normal rate.  Pulmonary:     Effort: Pulmonary effort is normal.  Abdominal:     General: There is no distension.     Palpations: Abdomen is soft.     Tenderness: There is no abdominal tenderness.  Skin:    General: Skin is warm and dry.  Neurological:     Mental Status: She is alert and oriented to person, place, and time.  Psychiatric:        Mood and Affect: Mood normal.        Behavior: Behavior normal.       Imaging: IR Radiologist Eval & Mgmt  Result Date: 05/06/2023 EXAM: ESTABLISHED PATIENT OFFICE VISIT CHIEF COMPLAINT: SEE NOTE IN EPIC HISTORY OF PRESENT ILLNESS: SEE NOTE IN EPIC REVIEW OF SYSTEMS: SEE NOTE IN EPIC PHYSICAL EXAMINATION: SEE NOTE IN EPIC ASSESSMENT AND PLAN: SEE NOTE IN EPIC Electronically Signed   By: Malachy Moan M.D.   On: 05/06/2023 16:09   IR EMBO TUMOR ORGAN ISCHEMIA INFARCT INC GUIDE ROADMAPPING  Result Date: 04/24/2023 INDICATION: Symptomatic uterine fibroids. EXAM: IR EMBO TUMOR ORGAN ISCHEMIA INFARCT INC GUIDE ROADMAPPING; IR FLUORO GUIDE NEEDLE PLACEMENT / BIOPSY 1.)  Superior hypogastric nerve block with fluoro guidance 2.)  Bilateral [place] MEDICATIONS: PRE PROCEDURE: 1000 mg Tylenol IV, 10 mg Decadron IV, 3 g Ancef IV, Protonix 40 mg  IV, Zofran 8 mg IV administered by radiology nursing under my supervision. INTRA PROCEDURE: Toradol 30 mg IV, Toradol 30 mg IM administered by radiology nursing under my supervision. POST PROCEDURE: Dilaudid 1 mg IV and Phenergan 25 mg p.o. administered by radiology nursing under my supervision. ANESTHESIA/SEDATION: Moderate (conscious) sedation was employed during this procedure. A total of Versed 4 mg and Fentanyl 250 mcg was administered intravenously by radiology nursing under my supervision. Moderate Sedation Time: 95 minutes. The patient's level of consciousness and vital signs were monitored continuously by radiology nursing throughout the procedure under my direct supervision. CONTRAST:  100 mL Omnipaque 300 FLUOROSCOPY: Radiation Exposure Index (as provided by the fluoroscopic device): 340.3 mGy Kerma COMPLICATIONS: None immediate. PROCEDURE: Informed consent was obtained from the patient following explanation of the procedure, risks, benefits and alternatives. The patient understands, agrees and consents for the procedure. All questions were addressed. A time out was performed prior to the initiation of the procedure. Maximal barrier sterile technique utilized including caps, mask, sterile gowns, sterile gloves, large sterile drape, hand hygiene, and Betadine prep. The right common femoral artery was interrogated with ultrasound and found to be widely patent. An image was obtained and stored for the medical record. Local anesthesia was attained by infiltration with 1% lidocaine. A small dermatotomy was made. Under real-time sonographic guidance, the vessel was punctured with a 21 gauge micropuncture needle. Using standard technique, the initial micro needle was exchanged over a 0.018 micro wire for a transitional 4 [place] micro sheath. The micro sheath was then exchanged over a 0.035 wire for a 5 French vascular sheath. A C2 cobra catheter was advanced over a Bentson wire up in over the aortic bifurcation  and into the left internal iliac artery. Attention was then turned to the superior hypogastric block portion of the procedure. The image intensifier was angulated until the L5 vertebral body could be viewed  en face. A suitable skin entry site targeting the central aspect of the L5 vertebral body was identified and marked. Local anesthesia was attained by infiltration with 1% lidocaine. Under intermittent fluoroscopic guidance, a 20 cm 22 gauge Chiba needle was carefully advanced onto the anterior surface of the L5 vertebral body. This was confirmed in the lateral projection. A gentle injection of contrast material was performed in both the lateral and frontal projections confirming that the needle tip is within the prevertebral soft tissues and there is no vascular uptake. 20 mL of 0.5% ropivacaine was then slowly injected. The needle was then removed. Attention was turned back to portion procedure. A left internal iliac arteriogram was performed. The origin of the left uterine artery was identified. A renegade hiFlo microcatheter was advanced over a Fathom 16 wire into the horizontal segment of the left uterine artery. Arteriography was performed confirming opacification of the intramural uterine branches. No evidence of cervical vaginal branch. Particle embolization was performed utilizing 2 vials of 500 micron Embozene and 3 vials of 700 micron Embozene and 2 vials of 900 micron Embozene. Postembolization arteriography confirms significantly decreased blood flow. The microcatheter was removed. The C2 cobra catheter was formed into a Waltman's loop and used to select the ipsilateral right internal iliac artery. Contrast injection was performed with digital subtraction angiography and the origin of the right uterine artery was identified. The high-flow microcatheter was advanced over the Fathom 16 wire into The horizontal segment of the right uterine artery. Arteriography was performed confirming catheter placement  and absence of cervicalvaginal branches distal to the catheter tip. Particle embolization was performed utilizing 2 vials of 500 micron Embozene and 2 vials of 700 micron Embozene. Follow-up contrast injection confirms near stasis. The Waltman's loop was un formed and the catheter removed over a Bentson wire. Hemostasis was then attained with the assistance of a Celt arterial closure device (ACD). IMPRESSION: 1. Technically successful superior hypogastric nerve block. 2. Technically successful bilateral uterine artery embolization. Signed, Sterling Big, MD, RPVI [facility] Electronically Signed   By: Malachy Moan M.D.   On: 04/24/2023 13:04   IR Fluoro Guide Ndl Plmt / BX  Result Date: 04/24/2023 INDICATION: Symptomatic uterine fibroids. EXAM: IR EMBO TUMOR ORGAN ISCHEMIA INFARCT INC GUIDE ROADMAPPING; IR FLUORO GUIDE NEEDLE PLACEMENT / BIOPSY 1.)  Superior hypogastric nerve block with fluoro guidance 2.)  Bilateral [place] MEDICATIONS: PRE PROCEDURE: 1000 mg Tylenol IV, 10 mg Decadron IV, 3 g Ancef IV, Protonix 40 mg IV, Zofran 8 mg IV administered by radiology nursing under my supervision. INTRA PROCEDURE: Toradol 30 mg IV, Toradol 30 mg IM administered by radiology nursing under my supervision. POST PROCEDURE: Dilaudid 1 mg IV and Phenergan 25 mg p.o. administered by radiology nursing under my supervision. ANESTHESIA/SEDATION: Moderate (conscious) sedation was employed during this procedure. A total of Versed 4 mg and Fentanyl 250 mcg was administered intravenously by radiology nursing under my supervision. Moderate Sedation Time: 95 minutes. The patient's level of consciousness and vital signs were monitored continuously by radiology nursing throughout the procedure under my direct supervision. CONTRAST:  100 mL Omnipaque 300 FLUOROSCOPY: Radiation Exposure Index (as provided by the fluoroscopic device): 340.3 mGy Kerma COMPLICATIONS: None  immediate. PROCEDURE: Informed consent was obtained from the patient following explanation of the procedure, risks, benefits and alternatives. The patient understands, agrees and consents for the procedure. All questions were addressed. A time out was performed prior to the initiation of the procedure. Maximal  barrier sterile technique utilized including caps, mask, sterile gowns, sterile gloves, large sterile drape, hand hygiene, and Betadine prep. The right common femoral artery was interrogated with ultrasound and found to be widely patent. An image was obtained and stored for the medical record. Local anesthesia was attained by infiltration with 1% lidocaine. A small dermatotomy was made. Under real-time sonographic guidance, the vessel was punctured with a 21 gauge micropuncture needle. Using standard technique, the initial micro needle was exchanged over a 0.018 micro wire for a transitional 4 [place] micro sheath. The micro sheath was then exchanged over a 0.035 wire for a 5 French vascular sheath. A C2 cobra catheter was advanced over a Bentson wire up in over the aortic bifurcation and into the left internal iliac artery. Attention was then turned to the superior hypogastric block portion of the procedure. The image intensifier was angulated until the L5 vertebral body could be viewed en face. A suitable skin entry site targeting the central aspect of the L5 vertebral body was identified and marked. Local anesthesia was attained by infiltration with 1% lidocaine. Under intermittent fluoroscopic guidance, a 20 cm 22 gauge Chiba needle was carefully advanced onto the anterior surface of the L5 vertebral body. This was confirmed in the lateral projection. A gentle injection of contrast material was performed in both the lateral and frontal projections confirming that the needle tip is within the prevertebral soft tissues and there is no vascular uptake. 20 mL of 0.5% ropivacaine was then slowly injected. The  needle was then removed. Attention was turned back to portion procedure. A left internal iliac arteriogram was performed. The origin of the left uterine artery was identified. A renegade hiFlo microcatheter was advanced over a Fathom 16 wire into the horizontal segment of the left uterine artery. Arteriography was performed confirming opacification of the intramural uterine branches. No evidence of cervical vaginal branch. Particle embolization was performed utilizing 2 vials of 500 micron Embozene and 3 vials of 700 micron Embozene and 2 vials of 900 micron Embozene. Postembolization arteriography confirms significantly decreased blood flow. The microcatheter was removed. The C2 cobra catheter was formed into a Waltman's loop and used to select the ipsilateral right internal iliac artery. Contrast injection was performed with digital subtraction angiography and the origin of the right uterine artery was identified. The high-flow microcatheter was advanced over the Fathom 16 wire into The horizontal segment of the right uterine artery. Arteriography was performed confirming catheter placement and absence of cervicalvaginal branches distal to the catheter tip. Particle embolization was performed utilizing 2 vials of 500 micron Embozene and 2 vials of 700 micron Embozene. Follow-up contrast injection confirms near stasis. The Waltman's loop was un formed and the catheter removed over a Bentson wire. Hemostasis was then attained with the assistance of a Celt arterial closure device (ACD). IMPRESSION: 1. Technically successful superior hypogastric nerve block. 2. Technically successful bilateral uterine artery embolization. Signed, Sterling Big, MD, RPVI [facility] Radiology Electronically Signed   By: Malachy Moan M.D.   On: 04/24/2023 13:04    Labs:  CBC: Recent Labs    09/24/22 0926 03/25/23 1100  WBC 5.0 6.9  HGB 11.5* 11.8  HCT 36.7 38.8  PLT  315.0 477*    COAGS: No results for input(s): "INR", "APTT" in the last 8760 hours.  BMP: Recent Labs    09/24/22 0926 03/25/23 1100  NA 140 139  K 4.1 4.2  CL 107 105  CO2 25 23  GLUCOSE 104* 101*  BUN 9 7  CALCIUM 9.2 9.5  CREATININE 0.97 1.02*    LIVER FUNCTION TESTS: Recent Labs    09/24/22 0926 03/25/23 1100  BILITOT 0.3 0.3  AST 12 13  ALT 8 11  ALKPHOS 57  --   PROT 6.8 7.5  ALBUMIN 3.8  --     TUMOR MARKERS: No results for input(s): "AFPTM", "CEA", "CA199", "CHROMGRNA" in the last 8760 hours.  Assessment and Plan:  Doing exceptionally well 2 weeks post uterine artery embolization.  She is completely recovered and may resume full activities.  No complications.   1.) Proceed with myomectomy at the discretion of Dr. Cherly Hensen as planned.  2.) No further IR follow-up.     Electronically Signed: Sterling Big 05/06/2023, 4:24 PM   I spent a total of  10 Minutes in face to face in clinical consultation, greater than 50% of which was counseling/coordinating care for uterine fibroids post embolization

## 2023-05-27 ENCOUNTER — Other Ambulatory Visit: Payer: Self-pay | Admitting: Physician Assistant

## 2023-05-28 ENCOUNTER — Other Ambulatory Visit: Payer: Self-pay | Admitting: Physician Assistant

## 2023-07-22 ENCOUNTER — Other Ambulatory Visit: Payer: Self-pay | Admitting: Obstetrics and Gynecology

## 2023-08-04 ENCOUNTER — Encounter: Payer: Self-pay | Admitting: Physician Assistant

## 2023-08-04 ENCOUNTER — Ambulatory Visit (INDEPENDENT_AMBULATORY_CARE_PROVIDER_SITE_OTHER): Payer: No Typology Code available for payment source | Admitting: Physician Assistant

## 2023-08-04 VITALS — BP 130/88 | HR 110 | Temp 97.2°F | Ht 69.0 in | Wt 299.4 lb

## 2023-08-04 DIAGNOSIS — F419 Anxiety disorder, unspecified: Secondary | ICD-10-CM | POA: Diagnosis not present

## 2023-08-04 DIAGNOSIS — I10 Essential (primary) hypertension: Secondary | ICD-10-CM | POA: Diagnosis not present

## 2023-08-04 DIAGNOSIS — F32A Depression, unspecified: Secondary | ICD-10-CM

## 2023-08-04 DIAGNOSIS — E282 Polycystic ovarian syndrome: Secondary | ICD-10-CM | POA: Diagnosis not present

## 2023-08-04 NOTE — Progress Notes (Signed)
Tara Fox is a 33 y.o. female here for a follow up of a pre-existing problem.  History of Present Illness:   Chief Complaint  Patient presents with   Medical Management of Chronic Issues    Hypertension, Obesity, PCOS    HPI  HTN  Reports compliance and good tolerance of 2.5 mg Amlodipine daily.  The home BP readings have been in the 130's range. States her BP is higher when she's having headaches. Patient denies chest pain, SOB, blurred vision, dizziness, unusual headaches, lower leg swelling. Patient is compliant with medication. Denies excessive caffeine intake, stimulant usage, excessive alcohol intake, or increase in salt  PCOS/Obesity  Patient reports taking liquid Metformin inconsistently as she was experiencing GI symptoms including diarrhea.  She continues to take Ozempic 0.25 mg weekly. Tolerates it well other than minimal constipation.  Will undergo an abdominal myomectomy later this month for her uterine fibroid.    Anxiety/Depression Reports compliance and good tolerance of 25 mg Zoloft daily.  States that she has noticed improvement of her symptoms with medication.  No concerns or symptoms at this time.   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 Not weekly   Drug use: Never    Past Surgical History:  Procedure Laterality Date   HERNIA REPAIR  1988   IR EMBO TUMOR ORGAN ISCHEMIA INFARCT INC GUIDE ROADMAPPING  04/24/2023   IR FLUORO GUIDED NEEDLE PLC ASPIRATION/INJECTION LOC  04/24/2023   IR RADIOLOGIST EVAL & MGMT  03/25/2023   IR RADIOLOGIST EVAL &  MGMT  05/06/2023    Family History  Problem Relation Age of Onset   Diabetes Mother    Hypertension Mother    Alcohol abuse Father    Hypertension Father    Sickle cell anemia Brother    Stroke Brother        x 3    Cancer Maternal Aunt        breast CA, 2 aunts   Diabetes Maternal Aunt    Colon cancer Maternal Grandmother 53   Dementia Paternal Grandmother     Allergies  Allergen Reactions   Dust Mite Extract     Current Medications:   Current Outpatient Medications:    amLODipine (NORVASC) 5 MG tablet, TAKE 1 TABLET (5 MG TOTAL) BY MOUTH DAILY., Disp: 90 tablet, Rfl: 1   busPIRone (BUSPAR) 5 MG tablet, Take 1 tablet (5 mg total) by mouth 3 (three) times daily as needed., Disp: 30 tablet, Rfl: 0   cetirizine (ZYRTEC ALLERGY) 10 MG tablet, Take 10 mg by mouth daily. OTC, Disp: , Rfl:    cloNIDine (CATAPRES) 0.1 MG tablet, Take 0.1 mg by mouth at bedtime., Disp: , Rfl:    Ferrous Sulfate (IRON PO), Take by mouth daily., Disp: , Rfl:    ibuprofen (ADVIL) 800 MG tablet, Take 800 mg by mouth every 6 (six) hours., Disp: , Rfl:    lisdexamfetamine (VYVANSE) 40 MG capsule, Take 40 mg  by mouth daily., Disp: , Rfl:    OZEMPIC, 0.25 OR 0.5 MG/DOSE, 2 MG/3ML SOPN, Inject 0.25 mg into the skin once a week., Disp: , Rfl:    sertraline (ZOLOFT) 25 MG tablet, TAKE 1 TABLET (25 MG TOTAL) BY MOUTH DAILY., Disp: 90 tablet, Rfl: 1   Metformin HCl 500 MG/5ML SOLN, Take 5 mLs (500 mg total) by mouth daily. (Patient not taking: Reported on 08/04/2023), Disp: 473 mL, Rfl: 1   Multiple Vitamins-Minerals (B COMPLEX-C-E-ZINC) tablet, Take 1 tablet by mouth daily. (Patient not taking: Reported on 08/04/2023), Disp: , Rfl:    Review of Systems:   Review of Systems  Gastrointestinal:  Positive for constipation and diarrhea.  Neurological:  Positive for headaches.    Vitals:   Vitals:   08/04/23 0852  BP: 130/88  Pulse: (!) 110  Temp: (!) 97.2 F (36.2 C)  TempSrc: Temporal  SpO2: 97%   Weight: 299 lb 6.1 oz (135.8 kg)  Height: 5\' 9"  (1.753 m)     Body mass index is 44.21 kg/m.  Physical Exam:   Physical Exam Vitals and nursing note reviewed.  Constitutional:      General: She is not in acute distress.    Appearance: She is well-developed. She is not ill-appearing or toxic-appearing.  Cardiovascular:     Rate and Rhythm: Normal rate and regular rhythm.     Pulses: Normal pulses.     Heart sounds: Normal heart sounds, S1 normal and S2 normal.  Pulmonary:     Effort: Pulmonary effort is normal.     Breath sounds: Normal breath sounds.  Skin:    General: Skin is warm and dry.  Neurological:     Mental Status: She is alert.     GCS: GCS eye subscore is 4. GCS verbal subscore is 5. GCS motor subscore is 6.  Psychiatric:        Speech: Speech normal.        Behavior: Behavior normal. Behavior is cooperative.     Assessment and Plan:   Primary hypertension Normotensive Continue amlodipine 2.5 mg daily Follow-up in 6 months for Comprehensive Physical Exam (CPE) preventive care annual visit -- sooner if concerns  PCOS (polycystic ovarian syndrome) Management per fertility specialist She will likely continue to hold metformin and Ozempic until after procedure Follow-up in 6 months for Comprehensive Physical Exam (CPE) preventive care annual visit -- sooner if concerns  Anxiety and depression Well controlled per patient Continue Zoloft 25 mg daily Denies suicidal ideation/hi Follow-up in 6 months for Comprehensive Physical Exam (CPE) preventive care annual visit -- sooner if concerns   Jarold Motto, PA-C  I,Safa M Kadhim,acting as a scribe for Jarold Motto, PA.,have documented all relevant documentation on the behalf of Jarold Motto, PA,as directed by  Jarold Motto, PA while in the presence of Jarold Motto, [place].   I, Jarold Motto, [place], have reviewed all documentation for this visit. The documentation on 08/04/23 for the exam, diagnosis,  procedures, and orders are all accurate and complete.

## 2023-08-07 ENCOUNTER — Encounter (HOSPITAL_COMMUNITY): Payer: Self-pay | Admitting: Obstetrics and Gynecology

## 2023-08-11 ENCOUNTER — Other Ambulatory Visit: Payer: Self-pay | Admitting: Obstetrics and Gynecology

## 2023-08-12 ENCOUNTER — Encounter (HOSPITAL_COMMUNITY): Payer: Self-pay | Admitting: Obstetrics and Gynecology

## 2023-08-12 NOTE — Pre-Procedure Instructions (Signed)
 Surgical Instructions   Your procedure is scheduled on :  Friday,  08-15-2023. Report to [facility] Main Entrance "A" at 11:00 A.M., then check in with the Admitting office. Any questions or running late day of surgery: call [phone]  Questions prior to your surgery date: call [phone] or [phone], Monday-Friday, 8am-4pm. If you experience any cold or flu symptoms such as cough, fever, chills, shortness of breath, etc. between now and your scheduled surgery, please notify your surgeon    Remember:  Do not eat after midnight the night before your surgery.  From midnight night before surgery you may have clear liquid diet until 10:00 AM.      You may drink clear liquids until 10:00 AM the morning of your surgery.   Clear liquids allowed are:  Water,              Carbonated Beverages,              Clear Tea (no milk, honey, etc.),              Black Coffee  (NO MILK, CREAM OR POWDERED CREAMER of any type)             Gatorade.             After 10:00 AM nothing by mouth including water,  candy,  gum,  and mints.    Take these medicines the morning of surgery with A SIP OF WATER : Sertraline (zoloft) Amlodipine (norvasc)   May take these medicines IF NEEDED:  Buspirone (buspar) Cetirizine (zyrtec)    One week prior to surgery, STOP taking any Aspirin (unless otherwise instructed by your surgeon) Aleve, Naproxen, Ibuprofen, Motrin, Advil, Goody's, BC's, all herbal medications, fish oil, and non-prescription vitamins.                     Do NOT Smoke (Tobacco/Vaping) for 24 hours prior to your procedure.  If you use a CPAP at night, you may bring your mask/headgear for your overnight stay.   You will be asked to remove any contacts, glasses, piercing's, hearing aid's, dentures/partials prior to surgery. Please bring cases for these items if needed.    Patients discharged the day of surgery will not be allowed to drive home, and someone needs to stay with them for 24  hours.  SURGICAL WAITING ROOM VISITATION Patients may have no more than 2 support people in the waiting area - these visitors may rotate.   Pre-op nurse will coordinate an appropriate time for 1 ADULT support person, who may not rotate, to accompany patient in pre-op.  Children under the age of 39 must have an adult with them who is not the patient and must remain in the main waiting area with an adult.  If the patient needs to stay at the hospital during part of their recovery, the visitor guidelines for inpatient rooms apply.  Please refer to the [facility] website for the visitor guidelines for any additional information.   If you received a COVID test during your pre-op visit  it is requested that you wear a mask when out in public, stay away from anyone that may not be feeling well and notify your surgeon if you develop symptoms. If you have been in contact with anyone that has tested positive in the last 10 days please notify you surgeon.      Pre-operative CHG Bathing Instructions   You can play a key role in reducing the  risk of infection after surgery. Your skin needs to be as free of germs as possible. You can reduce the number of germs on your skin by washing with CHG (chlorhexidine gluconate) soap before surgery. CHG is an antiseptic soap that kills germs and continues to kill germs even after washing.   DO NOT use if you have an allergy to chlorhexidine/CHG or antibacterial soaps. If your skin becomes reddened or irritated, stop using the CHG and notify Pre-Op nurse day of surgery.  If you have any skin irritation or problems with the surgical soap (CHG), do not use.  Please get a bar of gold Dial soap and shower with dial soap following the instructions below.             TAKE A SHOWER THE NIGHT BEFORE SURGERY AND THE DAY OF SURGERY    Please keep in mind the following:  DO NOT shave, including legs and underarms, 48 hours prior to surgery.   You may shave your face  before/day of surgery.  Place clean sheets on your bed the night before surgery Use a clean washcloth (not used since being washed) for each shower. DO NOT sleep with pet's night before surgery.  CHG Shower Instructions:  Wash your face and private area with normal soap. If you choose to wash your hair, wash first with your normal shampoo.  After you use shampoo/soap, rinse your hair and body thoroughly to remove shampoo/soap residue.  Turn the water OFF and apply half the bottle of CHG soap to a CLEAN washcloth.  Apply CHG soap ONLY FROM YOUR NECK DOWN TO YOUR TOES (washing for 3-5 minutes)  DO NOT use CHG soap on face, private areas, open wounds, or sores.  Pay special attention to the area where your surgery is being performed.  If you are having back surgery, having someone wash your back for you may be helpful. Wait 2 minutes after CHG soap is applied, then you may rinse off the CHG soap.  Pat dry with a clean towel  Put on clean pajamas    Additional instructions for the day of surgery: DO NOT APPLY any lotions, deodorants, cologne, or perfumes.   Do not wear jewelry or makeup Do not wear nail polish, gel polish, artificial nails, or any other type of covering on natural nails (fingers and toes) Do not bring valuables to the hospital. [facility] is not responsible for valuables/personal belongings. Put on clean/comfortable clothes.  Please brush your teeth.  Ask your nurse before applying any prescription medications to the skin.

## 2023-08-12 NOTE — Progress Notes (Signed)
 Spoke w/ via phone for pre-op interview--- pt Lab needs dos----  urine preg       Lab results------ lab appt 08-14-2023 @ 1030 getting CBC/ BMP/ T&S COVID test -----patient states asymptomatic no test needed Arrive at -------  1100 on 08-15-2023 NPO after MN NO Solid Food.  Clear liquids from MN until--- 1000 Pre-Surgery Ensure or G2: n/a  Med rec completed Medications to take morning of surgery ----- zoloft, norvasc Diabetic medication ----- n/a  GLP1 agonist last dose:  Jan 2025 GLP1 instructions:  per pt was given instructions by dr Sherin Quarry  Patient instructed no nail polish to be worn day of surgery Patient instructed to bring photo id and insurance card day of surgery Patient aware to have Driver (ride ) / caregiver    for 24 hours after surgery - mother, patricia Patient Special Instructions ----- will pick up bag w/ CHG and written instructions at lab appt Pre-Op special Instructions ----- n/a  Patient verbalized understanding of instructions that were given at this phone interview. Patient denies chest pain, sob, fever, cough at the interview.    Anesthesia Review:  no  PCP:  Jarold Motto PA (lov 08-04-2023:  Chest x-ray :no EKG :  no Echo : Stress test: no Cardiac Cath :   Activity level:  denies sob w/ any activity Sleep Study/ CPAP : no

## 2023-08-14 ENCOUNTER — Encounter (HOSPITAL_COMMUNITY)
Admission: RE | Admit: 2023-08-14 | Discharge: 2023-08-14 | Disposition: A | Discharge status: Home / Self Care | Payer: No Typology Code available for payment source | Source: Ambulatory Visit | Attending: Obstetrics and Gynecology | Admitting: Obstetrics and Gynecology

## 2023-08-14 DIAGNOSIS — I517 Cardiomegaly: Secondary | ICD-10-CM | POA: Diagnosis not present

## 2023-08-14 DIAGNOSIS — Z01818 Encounter for other preprocedural examination: Secondary | ICD-10-CM | POA: Insufficient documentation

## 2023-08-14 LAB — CBC
HCT: 35.3 % — ABNORMAL LOW (ref 36.0–46.0)
Hemoglobin: 11.1 g/dL — ABNORMAL LOW (ref 12.0–15.0)
MCH: 21.9 pg — ABNORMAL LOW (ref 26.0–34.0)
MCHC: 31.4 g/dL (ref 30.0–36.0)
MCV: 69.6 fL — ABNORMAL LOW (ref 80.0–100.0)
Platelets: 341 10*3/uL (ref 150–400)
RBC: 5.07 MIL/uL (ref 3.87–5.11)
RDW: 20 % — ABNORMAL HIGH (ref 11.5–15.5)
WBC: 5.7 10*3/uL (ref 4.0–10.5)
nRBC: 0 % (ref 0.0–0.2)

## 2023-08-14 LAB — BASIC METABOLIC PANEL
Anion gap: 8 (ref 5–15)
BUN: 8 mg/dL (ref 6–20)
CO2: 22 mmol/L (ref 22–32)
Calcium: 9.1 mg/dL (ref 8.9–10.3)
Chloride: 108 mmol/L (ref 98–111)
Creatinine, Ser: 1.04 mg/dL — ABNORMAL HIGH (ref 0.44–1.00)
GFR, Estimated: >60 mL/min (ref >60)
Glucose, Bld: 101 mg/dL — ABNORMAL HIGH (ref 70–99)
Potassium: 3.7 mmol/L (ref 3.5–5.1)
Sodium: 138 mmol/L (ref 135–145)

## 2023-08-14 LAB — TYPE AND SCREEN
ABO/RH(D): A POS
Antibody Screen: NEGATIVE

## 2023-08-15 ENCOUNTER — Encounter (HOSPITAL_COMMUNITY): Payer: Self-pay | Admitting: Anesthesiology

## 2023-08-15 ENCOUNTER — Inpatient Hospital Stay (HOSPITAL_COMMUNITY)
Admission: RE | Admit: 2023-08-15 | Payer: No Typology Code available for payment source | Source: Home / Self Care | Admitting: Obstetrics and Gynecology

## 2023-08-15 ENCOUNTER — Encounter (HOSPITAL_COMMUNITY): Admission: RE | Payer: Self-pay | Source: Home / Self Care

## 2023-08-15 DIAGNOSIS — Z01818 Encounter for other preprocedural examination: Secondary | ICD-10-CM

## 2023-08-15 HISTORY — DX: Hemangioma of intra-abdominal structures: D18.03

## 2023-08-15 HISTORY — DX: Intramural leiomyoma of uterus: D25.1

## 2023-08-15 HISTORY — DX: Iron deficiency anemia, unspecified: D50.9

## 2023-08-15 HISTORY — DX: Other seasonal allergic rhinitis: J30.2

## 2023-08-15 HISTORY — DX: Polycystic ovarian syndrome: E28.2

## 2023-08-15 HISTORY — DX: Presence of spectacles and contact lenses: Z97.3

## 2023-08-15 HISTORY — DX: Excessive and frequent menstruation with regular cycle: N92.0

## 2023-08-15 HISTORY — DX: Generalized anxiety disorder: F41.1

## 2023-08-15 HISTORY — DX: Attention-deficit hyperactivity disorder, unspecified type: F90.9

## 2023-08-15 HISTORY — DX: Major depressive disorder, single episode, unspecified: F32.9

## 2023-08-15 SURGERY — ABDOMINAL MYOMECTOMY
Anesthesia: General

## 2023-08-15 NOTE — Progress Notes (Signed)
 Spoke with pt regarding procedure. Pt states that insurance authorization has not come through and Dr. Cherly Hensen told her not to come in for surgery.

## 2023-08-27 ENCOUNTER — Encounter: Payer: Self-pay | Admitting: Physician Assistant

## 2023-08-27 LAB — THYROID PEROXIDASE (TPO) AB: Thyroid Peroxidase (TPO) Ab: 21

## 2023-09-04 ENCOUNTER — Encounter (HOSPITAL_COMMUNITY): Payer: Self-pay | Admitting: Obstetrics and Gynecology

## 2023-09-04 NOTE — Progress Notes (Signed)
 Spoke w/ via phone for pre-op interview--- pt Lab needs dos----  urine preg       Lab results------ lab appt 09-09-2023 @ 0900 getting CBC/ BMP/ T&S Current EKG in epic/ chart COVID test -----patient states asymptomatic no test needed Arrive at -------  0900 on 09-12-2023 NPO after MN NO Solid Food.  Clear liquids from MN until--- 0800 Pre-Surgery Ensure or G2: n/a  Med rec completed Medications to take morning of surgery ----- zoloft, norvasc Diabetic medication ----- n/a  GLP1 agonist last dose:  Jan 2025 GLP1 instructions:  per pt was given instructions by dr Sherin Quarry  Patient instructed no nail polish to be worn day of surgery Patient instructed to bring photo id and insurance card day of surgery Patient aware to have Driver (ride ) / caregiver    for 24 hours after surgery - mother, patricia Patient Special Instructions ----- will pick up bag w/ CHG and written instructions at lab appt Pre-Op special Instructions ----- n/a  Patient verbalized understanding of instructions that were given at this phone interview. Patient denies chest pain, sob, fever, cough at the interview.    Anesthesia Review:  no  PCP:  Jarold Motto PA (lov 08-04-2023:  Chest x-ray :no EKG :  08-14-2023 Echo : Stress test: no Cardiac Cath :   Activity level:  denies sob w/ any activity Sleep Study/ CPAP : no

## 2023-09-04 NOTE — Pre-Procedure Instructions (Addendum)
 Surgical Instructions   Your procedure is scheduled on :  Friday,  09-12-2023. Report to [facility] Main Entrance "A" at 09:00 A.M., then check in with the Admitting office. Any questions or running late day of surgery: call [phone]  Questions prior to your surgery date: call [phone] or [phone], Monday-Friday, 8am-4pm. If you experience any cold or flu symptoms such as cough, fever, chills, shortness of breath, etc. between now and your scheduled surgery, please notify your surgeon    Remember:  Do not eat after midnight the night before your surgery.  From midnight night before surgery you may have clear liquid diet until 08:00 AM.      You may drink clear liquids until 08:00 AM the morning of your surgery.   Clear liquids allowed are:  Water,              Carbonated Beverages,              Clear Tea (no milk, honey, etc.),              Black Coffee  (NO MILK, CREAM OR POWDERED CREAMER of any type)             Gatorade.             NO clear liquids after 08:00 AM day of surgery.  This includes no water,      candy,   gum,  and mints.    Take these medicines the morning of surgery with A SIP OF WATER : Sertraline (zoloft) Amlodipine (norvasc)   May take these medicines IF NEEDED:  Buspirone (buspar) Cetirizine (zyrtec)    One week prior to surgery, STOP taking any Aspirin (unless otherwise instructed by your surgeon) Aleve, Naproxen, Ibuprofen, Motrin, Advil, Goody's, BC's, all herbal medications, fish oil, and non-prescription vitamins.                     Do NOT Smoke (Tobacco/Vaping) for 24 hours prior to your procedure.  If you use a CPAP at night, you may bring your mask/headgear for your overnight stay.   You will be asked to remove any contacts, glasses, piercing's, hearing aid's, dentures/partials prior to surgery. Please bring cases for these items if needed.    Patients discharged the day of surgery will not be allowed to drive home, and someone  needs to stay with them for 24 hours.  SURGICAL WAITING ROOM VISITATION Patients may have no more than 2 support people in the waiting area - these visitors may rotate.   Pre-op nurse will coordinate an appropriate time for 1 ADULT support person, who may not rotate, to accompany patient in pre-op.  Children under the age of 48 must have an adult with them who is not the patient and must remain in the main waiting area with an adult.  If the patient needs to stay at the hospital during part of their recovery, the visitor guidelines for inpatient rooms apply.  Please refer to the [facility] website for the visitor guidelines for any additional information.   If you received a COVID test during your pre-op visit  it is requested that you wear a mask when out in public, stay away from anyone that may not be feeling well and notify your surgeon if you develop symptoms. If you have been in contact with anyone that has tested positive in the last 10 days please notify you surgeon.      Pre-operative CHG Bathing Instructions  You can play a key role in reducing the risk of infection after surgery. Your skin needs to be as free of germs as possible. You can reduce the number of germs on your skin by washing with CHG (chlorhexidine gluconate) soap before surgery. CHG is an antiseptic soap that kills germs and continues to kill germs even after washing.   DO NOT use if you have an allergy to chlorhexidine/CHG or antibacterial soaps. If your skin becomes reddened or irritated, stop using the CHG and notify Pre-Op nurse day of surgery.  If you have any skin irritation or problems with the surgical soap (CHG), do not use.  Please get a bar of gold Dial soap and shower with dial soap following the instructions below.             TAKE A SHOWER THE NIGHT BEFORE SURGERY AND THE DAY OF SURGERY    Please keep in mind the following:  DO NOT shave, including legs and underarms, 48 hours prior to surgery.    You may shave your face before/day of surgery.  Place clean sheets on your bed the night before surgery Use a clean washcloth (not used since being washed) for each shower. DO NOT sleep with pet's night before surgery.  CHG Shower Instructions:  Wash your face and private area with normal soap. If you choose to wash your hair, wash first with your normal shampoo.  After you use shampoo/soap, rinse your hair and body thoroughly to remove shampoo/soap residue.  Turn the water OFF and apply half the bottle of CHG soap to a CLEAN washcloth.  Apply CHG soap ONLY FROM YOUR NECK DOWN TO YOUR TOES (washing for 3-5 minutes)  DO NOT use CHG soap on face, private areas, open wounds, or sores.  Pay special attention to the area where your surgery is being performed.  If you are having back surgery, having someone wash your back for you may be helpful. Wait 2 minutes after CHG soap is applied, then you may rinse off the CHG soap.  Pat dry with a clean towel  Put on clean pajamas    Additional instructions for the day of surgery: DO NOT APPLY any lotions, oils, deodorants (may use underarm deodorant) , cologne/ perfumes or makeup Do not wear jewelry / piercing's/  metal/  permanent jewelry must be removed prior to arrival (this includes no plastic piercing) Do not wear nail polish, gel polish, artificial nails, or any other type of covering on natural nails (fingers and toes) Do not bring valuables to the hospital. [facility] is not responsible for valuables/personal belongings. Put on clean/comfortable clothes.  Please brush your teeth.  Ask your nurse before applying any prescription medications to the skin.

## 2023-09-09 ENCOUNTER — Encounter (HOSPITAL_COMMUNITY)
Admission: RE | Admit: 2023-09-09 | Discharge: 2023-09-09 | Disposition: A | Discharge status: Home / Self Care | Source: Ambulatory Visit | Attending: Obstetrics and Gynecology | Admitting: Obstetrics and Gynecology

## 2023-09-09 DIAGNOSIS — Z01818 Encounter for other preprocedural examination: Secondary | ICD-10-CM | POA: Insufficient documentation

## 2023-09-09 LAB — BASIC METABOLIC PANEL
Anion gap: 7 (ref 5–15)
BUN: 8 mg/dL (ref 6–20)
CO2: 22 mmol/L (ref 22–32)
Calcium: 8.9 mg/dL (ref 8.9–10.3)
Chloride: 109 mmol/L (ref 98–111)
Creatinine, Ser: 1.09 mg/dL — ABNORMAL HIGH (ref 0.44–1.00)
GFR, Estimated: >60 mL/min (ref >60)
Glucose, Bld: 120 mg/dL — ABNORMAL HIGH (ref 70–99)
Potassium: 3.9 mmol/L (ref 3.5–5.1)
Sodium: 138 mmol/L (ref 135–145)

## 2023-09-09 LAB — CBC
HCT: 35.9 % — ABNORMAL LOW (ref 36.0–46.0)
Hemoglobin: 11.3 g/dL — ABNORMAL LOW (ref 12.0–15.0)
MCH: 22 pg — ABNORMAL LOW (ref 26.0–34.0)
MCHC: 31.5 g/dL (ref 30.0–36.0)
MCV: 69.8 fL — ABNORMAL LOW (ref 80.0–100.0)
Platelets: 361 10*3/uL (ref 150–400)
RBC: 5.14 MIL/uL — ABNORMAL HIGH (ref 3.87–5.11)
RDW: 19.6 % — ABNORMAL HIGH (ref 11.5–15.5)
WBC: 6.1 10*3/uL (ref 4.0–10.5)
nRBC: 0 % (ref 0.0–0.2)

## 2023-09-09 LAB — TYPE AND SCREEN
ABO/RH(D): A POS
Antibody Screen: NEGATIVE

## 2023-09-12 ENCOUNTER — Other Ambulatory Visit: Payer: Self-pay

## 2023-09-12 ENCOUNTER — Inpatient Hospital Stay (HOSPITAL_COMMUNITY): Admitting: Certified Registered Nurse Anesthetist

## 2023-09-12 ENCOUNTER — Inpatient Hospital Stay (HOSPITAL_COMMUNITY)
Admission: RE | Disposition: A | Discharge status: Home / Self Care | Payer: Self-pay | Source: Home / Self Care | Attending: Obstetrics and Gynecology

## 2023-09-12 ENCOUNTER — Encounter (HOSPITAL_COMMUNITY): Payer: Self-pay | Admitting: Obstetrics and Gynecology

## 2023-09-12 ENCOUNTER — Inpatient Hospital Stay (HOSPITAL_COMMUNITY)
Admission: RE | Admit: 2023-09-12 | Discharge: 2023-09-14 | DRG: 742 | Disposition: A | Discharge status: Home / Self Care | Attending: Obstetrics and Gynecology | Admitting: Obstetrics and Gynecology

## 2023-09-12 DIAGNOSIS — D259 Leiomyoma of uterus, unspecified: Secondary | ICD-10-CM | POA: Diagnosis not present

## 2023-09-12 DIAGNOSIS — D573 Sickle-cell trait: Secondary | ICD-10-CM | POA: Diagnosis present

## 2023-09-12 DIAGNOSIS — Z01818 Encounter for other preprocedural examination: Principal | ICD-10-CM

## 2023-09-12 DIAGNOSIS — I10 Essential (primary) hypertension: Secondary | ICD-10-CM | POA: Diagnosis present

## 2023-09-12 DIAGNOSIS — N920 Excessive and frequent menstruation with regular cycle: Principal | ICD-10-CM | POA: Diagnosis present

## 2023-09-12 DIAGNOSIS — Z6841 Body Mass Index (BMI) 40.0 and over, adult: Secondary | ICD-10-CM | POA: Diagnosis not present

## 2023-09-12 DIAGNOSIS — R000 Tachycardia, unspecified: Secondary | ICD-10-CM | POA: Diagnosis present

## 2023-09-12 DIAGNOSIS — Z8249 Family history of ischemic heart disease and other diseases of the circulatory system: Secondary | ICD-10-CM

## 2023-09-12 DIAGNOSIS — D251 Intramural leiomyoma of uterus: Secondary | ICD-10-CM | POA: Diagnosis present

## 2023-09-12 DIAGNOSIS — D62 Acute posthemorrhagic anemia: Secondary | ICD-10-CM | POA: Diagnosis present

## 2023-09-12 DIAGNOSIS — F418 Other specified anxiety disorders: Secondary | ICD-10-CM

## 2023-09-12 DIAGNOSIS — Z9889 Other specified postprocedural states: Secondary | ICD-10-CM

## 2023-09-12 HISTORY — PX: MYOMECTOMY: SHX85

## 2023-09-12 LAB — POCT PREGNANCY, URINE: Preg Test, Ur: NEGATIVE

## 2023-09-12 SURGERY — MYOMECTOMY, ABDOMINAL APPROACH
Anesthesia: General | Site: Abdomen

## 2023-09-12 MED ORDER — ROCURONIUM BROMIDE 10 MG/ML (PF) SYRINGE
PREFILLED_SYRINGE | INTRAVENOUS | Status: AC
  Filled 2023-09-12: qty 10

## 2023-09-12 MED ORDER — SUGAMMADEX SODIUM 200 MG/2ML IV SOLN
INTRAVENOUS | Status: DC | PRN
  Administered 2023-09-12: 200 mg via INTRAVENOUS

## 2023-09-12 MED ORDER — POVIDONE-IODINE 10 % EX SWAB: 2.0000 | Freq: Once | CUTANEOUS | Status: DC

## 2023-09-12 MED ORDER — SERTRALINE HCL 50 MG PO TABS
25.0000 mg | ORAL_TABLET | Freq: Every day | ORAL | Status: DC
  Administered 2023-09-13 – 2023-09-14 (×2): 25 mg via ORAL
  Filled 2023-09-12 (×2): qty 1

## 2023-09-12 MED ORDER — DEXTROSE IN LACTATED RINGERS 5 % IV SOLN: INTRAVENOUS | Status: DC

## 2023-09-12 MED ORDER — HYDROMORPHONE 1 MG/ML IV SOLN
INTRAVENOUS | Status: DC
  Administered 2023-09-12: 0.8 mg via INTRAVENOUS
  Administered 2023-09-12: 30 mg via INTRAVENOUS
  Administered 2023-09-13: 2 mg via INTRAVENOUS

## 2023-09-12 MED ORDER — VASOPRESSIN 20 UNIT/ML IV SOLN
INTRAVENOUS | Status: AC
  Filled 2023-09-12: qty 3

## 2023-09-12 MED ORDER — DIPHENHYDRAMINE HCL 12.5 MG/5ML PO ELIX: 12.5000 mg | ORAL_SOLUTION | Freq: Four times a day (QID) | ORAL | Status: DC | PRN

## 2023-09-12 MED ORDER — MIDAZOLAM HCL 2 MG/2ML IJ SOLN
INTRAMUSCULAR | Status: AC
  Filled 2023-09-12: qty 2

## 2023-09-12 MED ORDER — SODIUM CHLORIDE (PF) 0.9 % IJ SOLN
INTRAMUSCULAR | Status: AC
  Filled 2023-09-12: qty 10

## 2023-09-12 MED ORDER — DEXAMETHASONE SODIUM PHOSPHATE 10 MG/ML IJ SOLN
INTRAMUSCULAR | Status: DC | PRN
  Administered 2023-09-12: 8 mg via INTRAVENOUS

## 2023-09-12 MED ORDER — VASOPRESSIN 20 UNIT/ML IV SOLN
INTRAVENOUS | Status: AC
  Filled 2023-09-12: qty 1

## 2023-09-12 MED ORDER — KETOROLAC TROMETHAMINE 30 MG/ML IJ SOLN
INTRAMUSCULAR | Status: DC | PRN
  Administered 2023-09-12: 30 mg via INTRAVENOUS

## 2023-09-12 MED ORDER — LACTATED RINGERS IV SOLN: INTRAVENOUS | Status: DC

## 2023-09-12 MED ORDER — CHLORHEXIDINE GLUCONATE 0.12 % MT SOLN
15.0000 mL | Freq: Once | OROMUCOSAL | Status: AC
  Administered 2023-09-12: 15 mL via OROMUCOSAL
  Filled 2023-09-12: qty 15

## 2023-09-12 MED ORDER — VASOPRESSIN 20 UNIT/ML IV SOLN: INTRAVENOUS | Status: DC | PRN

## 2023-09-12 MED ORDER — FENTANYL CITRATE (PF) 250 MCG/5ML IJ SOLN
INTRAMUSCULAR | Status: AC
  Filled 2023-09-12: qty 5

## 2023-09-12 MED ORDER — BUPIVACAINE HCL (PF) 0.25 % IJ SOLN
INTRAMUSCULAR | Status: AC
  Filled 2023-09-12: qty 30

## 2023-09-12 MED ORDER — MIDAZOLAM HCL 5 MG/5ML IJ SOLN
INTRAMUSCULAR | Status: DC | PRN
  Administered 2023-09-12: 2 mg via INTRAVENOUS

## 2023-09-12 MED ORDER — NALOXONE HCL 0.4 MG/ML IJ SOLN: 0.4000 mg | INTRAMUSCULAR | Status: DC | PRN

## 2023-09-12 MED ORDER — SODIUM CHLORIDE 0.9 % IV SOLN: INTRAVENOUS | Status: DC | PRN

## 2023-09-12 MED ORDER — HEMOSTATIC AGENTS (NO CHARGE) OPTIME
TOPICAL | Status: DC | PRN
  Administered 2023-09-12: 4

## 2023-09-12 MED ORDER — MENTHOL 3 MG MT LOZG: 1.0000 | LOZENGE | OROMUCOSAL | Status: DC | PRN

## 2023-09-12 MED ORDER — DEXAMETHASONE SODIUM PHOSPHATE 10 MG/ML IJ SOLN
INTRAMUSCULAR | Status: AC
  Filled 2023-09-12: qty 1

## 2023-09-12 MED ORDER — ONDANSETRON HCL 4 MG/2ML IJ SOLN
INTRAMUSCULAR | Status: DC | PRN
  Administered 2023-09-12: 4 mg via INTRAVENOUS

## 2023-09-12 MED ORDER — OXYCODONE HCL 5 MG PO TABS: 5.0000 mg | ORAL_TABLET | ORAL | Status: DC | PRN

## 2023-09-12 MED ORDER — SODIUM CHLORIDE (PF) 0.9 % IJ SOLN
INTRAMUSCULAR | Status: AC
  Filled 2023-09-12: qty 50

## 2023-09-12 MED ORDER — DIPHENHYDRAMINE HCL 50 MG/ML IJ SOLN: 12.5000 mg | Freq: Four times a day (QID) | INTRAMUSCULAR | Status: DC | PRN

## 2023-09-12 MED ORDER — ROCURONIUM BROMIDE 10 MG/ML (PF) SYRINGE
PREFILLED_SYRINGE | INTRAVENOUS | Status: DC | PRN
  Administered 2023-09-12: 20 mg via INTRAVENOUS
  Administered 2023-09-12: 100 mg via INTRAVENOUS

## 2023-09-12 MED ORDER — SIMETHICONE 80 MG PO CHEW
80.0000 mg | CHEWABLE_TABLET | Freq: Four times a day (QID) | ORAL | Status: DC | PRN
  Administered 2023-09-13 – 2023-09-14 (×3): 80 mg via ORAL
  Filled 2023-09-12 (×3): qty 1

## 2023-09-12 MED ORDER — PROPOFOL 10 MG/ML IV BOLUS
INTRAVENOUS | Status: AC
  Filled 2023-09-12: qty 20

## 2023-09-12 MED ORDER — HYDROMORPHONE HCL 1 MG/ML IJ SOLN
INTRAMUSCULAR | Status: DC | PRN
  Administered 2023-09-12 (×2): .5 mg via INTRAVENOUS

## 2023-09-12 MED ORDER — CEFAZOLIN SODIUM-DEXTROSE 2-4 GM/100ML-% IV SOLN
INTRAVENOUS | Status: AC
  Filled 2023-09-12: qty 100

## 2023-09-12 MED ORDER — ROCURONIUM BROMIDE 10 MG/ML (PF) SYRINGE
PREFILLED_SYRINGE | INTRAVENOUS | Status: AC
  Filled 2023-09-12: qty 20

## 2023-09-12 MED ORDER — HYDROMORPHONE HCL 1 MG/ML IJ SOLN
INTRAMUSCULAR | Status: AC
  Filled 2023-09-12: qty 0.5

## 2023-09-12 MED ORDER — ONDANSETRON HCL 4 MG/2ML IJ SOLN
INTRAMUSCULAR | Status: AC
  Filled 2023-09-12: qty 4

## 2023-09-12 MED ORDER — BUPIVACAINE HCL (PF) 0.25 % IJ SOLN
INTRAMUSCULAR | Status: DC | PRN
Start: 2023-09-12 — End: 2023-09-12
  Administered 2023-09-12: 10 mL

## 2023-09-12 MED ORDER — LIDOCAINE 2% (20 MG/ML) 5 ML SYRINGE
INTRAMUSCULAR | Status: DC | PRN
  Administered 2023-09-12: 100 mg via INTRAVENOUS

## 2023-09-12 MED ORDER — VASOPRESSIN 20 UNIT/ML IV SOLN
INTRAVENOUS | Status: DC | PRN
  Administered 2023-09-12: 67 mL via INTRAMUSCULAR

## 2023-09-12 MED ORDER — SODIUM CHLORIDE (PF) 0.9 % IJ SOLN
INTRAMUSCULAR | Status: AC
  Filled 2023-09-12: qty 100

## 2023-09-12 MED ORDER — HYDROMORPHONE 1 MG/ML IV SOLN
INTRAVENOUS | Status: AC
Start: 2023-09-12 — End: 2023-09-13
  Filled 2023-09-12: qty 30

## 2023-09-12 MED ORDER — LIDOCAINE 2% (20 MG/ML) 5 ML SYRINGE
INTRAMUSCULAR | Status: AC
  Filled 2023-09-12: qty 5

## 2023-09-12 MED ORDER — TRANEXAMIC ACID-NACL 1000-0.7 MG/100ML-% IV SOLN
1000.0000 mg | Freq: Once | INTRAVENOUS | Status: AC
  Administered 2023-09-12: 1000 mg via INTRAVENOUS
  Filled 2023-09-12: qty 100

## 2023-09-12 MED ORDER — IBUPROFEN 600 MG PO TABS
600.0000 mg | ORAL_TABLET | Freq: Four times a day (QID) | ORAL | Status: DC
  Administered 2023-09-13 – 2023-09-14 (×6): 600 mg via ORAL
  Filled 2023-09-12 (×7): qty 1

## 2023-09-12 MED ORDER — AMLODIPINE BESYLATE 5 MG PO TABS: 5.0000 mg | ORAL_TABLET | Freq: Every day | ORAL | Status: DC

## 2023-09-12 MED ORDER — CEFAZOLIN SODIUM-DEXTROSE 3-4 GM/150ML-% IV SOLN
3.0000 g | INTRAVENOUS | Status: AC
  Administered 2023-09-12: 3 g via INTRAVENOUS

## 2023-09-12 MED ORDER — DEXMEDETOMIDINE HCL IN NACL 80 MCG/20ML IV SOLN
INTRAVENOUS | Status: DC | PRN
  Administered 2023-09-12: 12 ug via INTRAVENOUS
  Administered 2023-09-12: 10 ug via INTRAVENOUS
  Administered 2023-09-12: 8 ug via INTRAVENOUS

## 2023-09-12 MED ORDER — ACETAMINOPHEN 500 MG PO TABS
1000.0000 mg | ORAL_TABLET | Freq: Four times a day (QID) | ORAL | Status: DC
  Administered 2023-09-12 – 2023-09-14 (×7): 1000 mg via ORAL
  Filled 2023-09-12 (×7): qty 2

## 2023-09-12 MED ORDER — FENTANYL CITRATE (PF) 100 MCG/2ML IJ SOLN
INTRAMUSCULAR | Status: DC | PRN
  Administered 2023-09-12: 50 ug via INTRAVENOUS
  Administered 2023-09-12: 100 ug via INTRAVENOUS
  Administered 2023-09-12 (×3): 50 ug via INTRAVENOUS

## 2023-09-12 MED ORDER — ORAL CARE MOUTH RINSE: 15.0000 mL | Freq: Once | OROMUCOSAL | Status: AC

## 2023-09-12 MED ORDER — PROPOFOL 10 MG/ML IV BOLUS
INTRAVENOUS | Status: DC | PRN
  Administered 2023-09-12: 200 mg via INTRAVENOUS

## 2023-09-12 MED ORDER — LISDEXAMFETAMINE DIMESYLATE 20 MG PO CAPS
40.0000 mg | ORAL_CAPSULE | Freq: Every day | ORAL | Status: DC
  Filled 2023-09-12: qty 2

## 2023-09-12 MED ORDER — VASOPRESSIN 20 UNIT/ML IV SOLN
INTRAVENOUS | Status: AC
  Filled 2023-09-12: qty 2

## 2023-09-12 MED ORDER — SODIUM CHLORIDE (PF) 0.9 % IJ SOLN
INTRAMUSCULAR | Status: AC
  Filled 2023-09-12: qty 30

## 2023-09-12 MED ORDER — CEFAZOLIN SODIUM-DEXTROSE 1-4 GM/50ML-% IV SOLN
INTRAVENOUS | Status: AC
  Filled 2023-09-12: qty 50

## 2023-09-12 MED ORDER — PANTOPRAZOLE SODIUM 40 MG PO TBEC
40.0000 mg | DELAYED_RELEASE_TABLET | Freq: Every day | ORAL | Status: DC
  Administered 2023-09-13 – 2023-09-14 (×2): 40 mg via ORAL
  Filled 2023-09-12 (×2): qty 1

## 2023-09-12 MED ORDER — ONDANSETRON HCL 4 MG/2ML IJ SOLN
4.0000 mg | Freq: Four times a day (QID) | INTRAMUSCULAR | Status: DC | PRN
  Administered 2023-09-12: 4 mg via INTRAVENOUS
  Filled 2023-09-12: qty 2

## 2023-09-12 MED ORDER — SODIUM CHLORIDE 0.9% FLUSH: 9.0000 mL | INTRAVENOUS | Status: DC | PRN

## 2023-09-12 SURGICAL SUPPLY — 41 items
BARRIER ADHS 3X4 INTERCEED (GAUZE/BANDAGES/DRESSINGS) ×1 IMPLANT
BENZOIN TINCTURE PRP APPL 2/3 (GAUZE/BANDAGES/DRESSINGS) ×1 IMPLANT
CANISTER SUCT 3000ML PPV (MISCELLANEOUS) ×1 IMPLANT
CONT SPEC PATH 64OZ SNAP LID (MISCELLANEOUS) ×1 IMPLANT
DRAPE CESAREAN BIRTH W POUCH (DRAPES) ×1 IMPLANT
DRAPE WARM FLUID 44X44 (DRAPES) ×1 IMPLANT
DRSG OPSITE POSTOP 4X10 (GAUZE/BANDAGES/DRESSINGS) ×1 IMPLANT
DURAPREP 26ML APPLICATOR (WOUND CARE) ×1 IMPLANT
ELECT NDL TIP 2.8 STRL (NEEDLE) ×1 IMPLANT
ELECT NEEDLE TIP 2.8 STRL (NEEDLE) ×1 IMPLANT
GLOVE BIOGEL PI IND STRL 7.0 (GLOVE) ×3 IMPLANT
GLOVE ECLIPSE 6.5 STRL STRAW (GLOVE) ×1 IMPLANT
GOWN STRL REUS W/ TWL LRG LVL3 (GOWN DISPOSABLE) ×3 IMPLANT
KIT TURNOVER KIT B (KITS) ×1 IMPLANT
NDL HYPO 22X1.5 SAFETY MO (MISCELLANEOUS) ×1 IMPLANT
NDL SPNL 22GX3.5 QUINCKE BK (NEEDLE) IMPLANT
NEEDLE HYPO 22X1.5 SAFETY MO (MISCELLANEOUS) ×1 IMPLANT
NEEDLE SPNL 22GX3.5 QUINCKE BK (NEEDLE) ×1 IMPLANT
NS IRRIG 1000ML POUR BTL (IV SOLUTION) ×1 IMPLANT
PACK ABDOMINAL GYN (CUSTOM PROCEDURE TRAY) ×1 IMPLANT
PAD ARMBOARD POSITIONER FOAM (MISCELLANEOUS) ×1 IMPLANT
PAD OB MATERNITY 11 LF (PERSONAL CARE ITEMS) ×1 IMPLANT
RTRCTR C-SECT PINK 25CM LRG (MISCELLANEOUS) ×1 IMPLANT
SPIKE FLUID TRANSFER (MISCELLANEOUS) ×1 IMPLANT
SPONGE T-LAP 18X18 ~~LOC~~+RFID (SPONGE) IMPLANT
STAPLER INSORB 30 2030 C-SECTI (MISCELLANEOUS) IMPLANT
STRIP CLOSURE SKIN 1/2X4 (GAUZE/BANDAGES/DRESSINGS) ×1 IMPLANT
STRIP CLOSURE SKIN 1/4X4 (GAUZE/BANDAGES/DRESSINGS) IMPLANT
SUT MON AB 2-0 SH27 (SUTURE) IMPLANT
SUT MON AB 3-0 SH27 (SUTURE) ×3 IMPLANT
SUT MON AB 4-0 PS1 27 (SUTURE) ×1 IMPLANT
SUT PLAIN 2 0 XLH (SUTURE) IMPLANT
SUT VIC AB 0 CT1 18XCR BRD8 (SUTURE) ×1 IMPLANT
SUT VIC AB 0 CT1 36 (SUTURE) ×2 IMPLANT
SUT VIC AB 0 CTX 18 (SUTURE) IMPLANT
SUT VIC AB 2-0 CT1 TAPERPNT 27 (SUTURE) ×1 IMPLANT
SUT VIC AB 2-0 SH 27XBRD (SUTURE) IMPLANT
SUT VICRYL 4-0 PS2 18IN ABS (SUTURE) IMPLANT
SYR CONTROL 10ML LL (SYRINGE) ×2 IMPLANT
TOWEL GREEN STERILE FF (TOWEL DISPOSABLE) ×2 IMPLANT
TRAY FOLEY W/BAG SLVR 14FR (SET/KITS/TRAYS/PACK) ×1 IMPLANT

## 2023-09-12 NOTE — Anesthesia Preprocedure Evaluation (Addendum)
 Anesthesia Evaluation  Patient identified by MRN, date of birth, ID band Patient awake    Reviewed: Allergy & Precautions, NPO status , Patient's Chart, lab work & pertinent test results, reviewed documented beta blocker date and time   History of Anesthesia Complications Negative for: history of anesthetic complications  Airway Mallampati: II  TM Distance: >3 FB     Dental no notable dental hx.    Pulmonary neg COPD, neg PE   breath sounds clear to auscultation       Cardiovascular hypertension, (-) CAD, (-) Past MI and (-) Cardiac Stents  Rhythm:Regular Rate:Normal     Neuro/Psych neg Headaches, neg Seizures PSYCHIATRIC DISORDERS Anxiety Depression       GI/Hepatic ,neg GERD  ,,(+) neg Cirrhosis        Endo/Other    Class 4 obesity  Renal/GU Renal disease  Female GU complaint     Musculoskeletal   Abdominal   Peds  Hematology  (+) Blood dyscrasia, anemia   Anesthesia Other Findings   Reproductive/Obstetrics                             Anesthesia Physical Anesthesia Plan  ASA: 3  Anesthesia Plan: General   Post-op Pain Management:    Induction: Intravenous  PONV Risk Score and Plan: 2 and Ondansetron and Dexamethasone  Airway Management Planned: Oral ETT  Additional Equipment:   Intra-op Plan:   Post-operative Plan: Extubation in OR  Informed Consent: I have reviewed the patients History and Physical, chart, labs and discussed the procedure including the risks, benefits and alternatives for the proposed anesthesia with the patient or authorized representative who has indicated his/her understanding and acceptance.     Dental advisory given  Plan Discussed with: CRNA  Anesthesia Plan Comments:        Anesthesia Quick Evaluation

## 2023-09-12 NOTE — Brief Op Note (Signed)
 09/12/2023  4:57 PM  PATIENT:  Tara Fox  33 y.o. female  PRE-OPERATIVE DIAGNOSIS:  Menorrhagia with regular cycle Uterine fibroids, morbid obesity, chronic HTN  POST-OPERATIVE DIAGNOSIS:  Menorrhagia with regular cycle, uterine fibroids, morbid obesity, chronic hypertension  PROCEDURE:  exp laparotomy, abdominal myomectomy  SURGEON:  Surgeons and Role:    * Maxie Better, MD - Primary  PHYSICIAN ASSISTANT:   ASSISTANTS: Donne Hazel,, RNFA   ANESTHESIA:   general Findings: 25 wk size uterus with multiple degenerating uterine fibroids, nl tubes and ovaries EBL:  210 mL   BLOOD ADMINISTERED:none  DRAINS: none   LOCAL MEDICATIONS USED:  MARCAINE     SPECIMEN:  Source of Specimen:  myomas  DISPOSITION OF SPECIMEN:  PATHOLOGY  COUNTS:  YES  TOURNIQUET:  * No tourniquets in log *  DICTATION: .Other Dictation: Dictation Number [id number]  PLAN OF CARE: Admit to inpatient   PATIENT DISPOSITION:  PACU - hemodynamically stable.   Delay start of Pharmacological VTE agent (>24hrs) due to surgical blood loss or risk of bleeding: no

## 2023-09-12 NOTE — H&P (Signed)
 Tara Fox is an 33 y.o. female. BF presents for exp lap, myomectomy due to symptomatic large uterine fibroids.  Pt has had uterine artery embolization to help decrease the size of her uterus to facilitate the surgery  Pertinent Gynecological History: Menses:  menorrhagia Bleeding: menorhagia Contraception: none DES exposure: denies Blood transfusions: none Sexually transmitted diseases: no past history Previous GYN Procedures:  uterine artery embolization   Last mammogram:  n/a  Date: n/a Last pap: normal Date: 05/20/2022 OB History: G0, P0   Menstrual History: Menarche age: n/a Patient's last menstrual period was 08/14/2023 (exact date).    Past Medical History:  Diagnosis Date   ADHD (attention deficit hyperactivity disorder)    GAD (generalized anxiety disorder)    Hepatic hemangioma    evaluated by [facility] --- dawn drazen NP (note in epic 11-09-2021)  last MRI in epic 11-14-2021 benign , w/ normal  liver function panel, negative HBV/ HCV   History of positive PPD 2007   per pt positive PPD test in 2007  treated prophylactically   Hypertension 11/09/2021   IDA (iron deficiency anemia)    Intramural uterine fibroid    MDD (major depressive disorder)    Menorrhagia with regular cycle    PCOS (polycystic ovarian syndrome)    Seasonal allergies    Sickle cell trait (HCC)    Wears glasses     Past Surgical History:  Procedure Laterality Date   INGUINAL HERNIA REPAIR Right 1998   IR EMBO TUMOR ORGAN ISCHEMIA INFARCT INC GUIDE ROADMAPPING  04/24/2023   IR FLUORO GUIDED NEEDLE PLC ASPIRATION/INJECTION LOC  04/24/2023   IR RADIOLOGIST EVAL & MGMT  03/25/2023   IR RADIOLOGIST EVAL & MGMT  05/06/2023    Family History  Problem Relation Age of Onset   Diabetes Mother    Hypertension Mother    Alcohol abuse Father    Hypertension Father    Sickle cell anemia Brother    Stroke Brother        x 3    Cancer Maternal Aunt        breast CA, 2 aunts   Diabetes  Maternal Aunt    Colon cancer Maternal Grandmother 54   Dementia Paternal Grandmother     Social History:  reports that she has never smoked. She has never used smokeless tobacco. She reports that she does not currently use alcohol. She reports that she does not use drugs.  Allergies: No Known Allergies  No medications prior to admission.    Review of Systems  All other systems reviewed and are negative.   Height 5\' 9"  (1.753 m), weight 135.6 kg, last menstrual period 08/14/2023. Physical Exam Constitutional:      Appearance: She is obese.  Eyes:     Extraocular Movements: Extraocular movements intact.  Cardiovascular:     Rate and Rhythm: Regular rhythm.     Heart sounds: Normal heart sounds.  Pulmonary:     Breath sounds: Normal breath sounds.  Abdominal:     Comments: Soft non tender, mass above umbilicus  Genitourinary:    General: Normal vulva.     Comments: Vagina normal  Cervix palpable Uterus 25 wk size Adnexa non palpable Musculoskeletal:        General: Normal range of motion.     Cervical back: Neck supple.  Skin:    General: Skin is warm and dry.  Neurological:     General: No focal deficit present.     Mental Status: She  is alert and oriented to person, place, and time.  Psychiatric:        Mood and Affect: Mood normal.        Behavior: Behavior normal.     No results found for this or any previous visit (from the past 24 hours).  No results found.  Assessment/Plan: Menorrhagia  Uterine fibroids P) exp lap, abdominal myomectomy. Procedure explained. Risk of surgery includes infection, bleeding, possible need for blood transfusion and its risk, use of cell saver application, internal scar tissue that may cause infertility, need for C/S in the future, up to 30% chance of needing same surgery in the future due to other fibroids not seen at this surgery grows, injury to surrounding organ structures, type of skin( vertical vs horizontal) incision also  discussed.  Postoperative care and criteria for discharge discussed All ? answered  Tara Fox A Tara Fox 09/12/2023, 3:04 AM

## 2023-09-12 NOTE — Interval H&P Note (Signed)
 History and Physical Interval Note:  09/12/2023 11:06 AM  Tara Fox  has presented today for surgery, with the diagnosis of Menorrhagia with regular cycle Intramural uterine fibroid hypertension.  The various methods of treatment have been discussed with the patient and family. After consideration of risks, benefits and other options for treatment, the patient has consented to  Procedure(s): MYOMECTOMY, ABDOMINAL APPROACH (N/A) APPLICATION OF CELL SAVER (N/A) as a surgical intervention.  The patient's history has been reviewed, patient examined, no change in status, stable for surgery.  I have reviewed the patient's chart and labs.  Questions were answered to the patient's satisfaction.     [name] A Kalon Erhardt

## 2023-09-12 NOTE — Anesthesia Procedure Notes (Signed)
 Procedure Name: Intubation Date/Time: 09/12/2023 12:14 PM  Performed by: Bishop Limbo, CRNAPre-anesthesia Checklist: Patient identified, Emergency Drugs available, Suction available and Patient being monitored Patient Re-evaluated:Patient Re-evaluated prior to induction Oxygen Delivery Method: Circle System Utilized Preoxygenation: Pre-oxygenation with 100% oxygen Induction Type: IV induction Ventilation: Mask ventilation without difficulty Laryngoscope Size: Mac and 3 Grade View: Grade I Tube type: Oral Tube size: 7.0 mm Number of attempts: 1 Airway Equipment and Method: Stylet Placement Confirmation: ETT inserted through vocal cords under direct vision, positive ETCO2 and breath sounds checked- equal and bilateral Secured at: 22 cm Tube secured with: Tape Dental Injury: Teeth and Oropharynx as per pre-operative assessment

## 2023-09-12 NOTE — Op Note (Signed)
 NAMEAEDYN, Tara Fox MEDICAL RECORD NO: [id number] ACCOUNT NO: [id number] DATE OF BIRTH: 09/22/1990 FACILITY: [facility] LOCATION: [facility] PHYSICIAN: Jodeci Roarty A. Cherly Hensen, MD  Operative Report   DATE OF PROCEDURE: 09/12/2023  PREOPERATIVE DIAGNOSES: 1. Symptomatic uterine fibroids. 2. Morbid obesity.  PROCEDURE: Exploratory laparotomy, abdominal myomectomy.  POSTOPERATIVE DIAGNOSES: 1. Symptomatic uterine fibroids. 2. Morbid obesity.  ANESTHESIA: General.  SURGEON: Maxie Better, MD.  ASSISTANTLaureen Ochs, RNFA.  DESCRIPTION OF PROCEDURE: Under adequate general anesthesia, the patient was placed in the supine position. She was sterilely prepped and draped in the usual fashion. An indwelling Foley catheter was sterilely placed. After examination of the patient,  the decision had been made to do a vertical incision. 0.25% Marcaine was injected along the planned vertical incision site. A vertical incision was then made and carried around the umbilicus to about 1.5 inches above that. The incision was carried down  to the rectus fascia. The rectus fascia was opened vertically and extended superiorly and inferiorly. The parietal peritoneum was opened sharply and the incision was extended superiorly and inferiorly. Palpation of the abdomen was notable for a large  fibroid uterus. The uterus was able to be exteriorized, showing evidence of a large anterior fundal, a midbody about 10-12 cm fibroid, and a lower uterine segment fibroid with additional fibroid posteriorly as well. The procedure was started with  injection of dilute solution of Pitressin over the fundal fibroid, keeping clear of what the actual fundal location was by having in view the round ligament and the tubes and ovaries location bilaterally. A vertical incision was then made. The fibroid  was enucleated from its base, and an additional smaller fibroid was found below. The palpation of that incision did not reveal any  defect, did not reveal any other fibroids, and therefore, it was closed with interrupted 0 Vicryl sutures until about  three-quarters of the way of the incision. It was a little bit taught to close due to the additional other fibroids. Therefore, the procedure was continued with additional Pitressin injection along both the other bigger fibroids below with the same  procedure being performed with enucleation of the fibroids out of their bases, removing any additional fibroids and closing the deep defect with 0 Vicryl figure-of-eight sutures until the serosal surface reached, at which time 3-0 Monocryl baseball suture was placed. A smaller fibroid posterior was also removed. There was a posterolateral fibroid under the right ovary and the IP area. Careful reconsideration resulted in still removing that using a horizontal incision on the side and carefully  enucleating the fibroid and closing that incision. When all palpable fibroids had been removed and closed, the uterus was irrigated. The pelvis was irrigated. Suction of debris. Interceed was placed overlying all the incisions, and the uterus returned  back to the abdomen. The parietal peritoneum was then closed with 2-0 Vicryl. The rectus fascia was closed with 0 Vicryl x2. The subcutaneous area, which was at least 2 inches deep, was closed with interrupted 2-0 plain suture was placed. To carefully  approximate the umbilical incised area, 4-0 Monocryl suture was used to close the upper part of the incision and involving the surrounding periumbilical area, and then an absorbable staple was then used to close the remaining lower portion of the incision. Benzoin and Steri-Strips were then placed, and a honeycomb dressing was placed over that.  SPECIMEN: Myomas weighing 1447 g.  INTRAOPERATIVE FLUIDS: 2 L.  URINE OUTPUT: 300 mL.  ESTIMATED BLOOD LOSS: 210 mL.  COUNTS:  Sponge and  instrument counts x2 was correct.   COMPLICATIONS: None.  DISPOSITION:   The patient tolerated the procedure well, was transferred to recovery room in stable condition.    SUJ D: 09/12/2023 5:10:40 pm T: 09/12/2023 11:58:00 pm  JOB: [id number]/ [id number]

## 2023-09-12 NOTE — Transfer of Care (Signed)
 Immediate Anesthesia Transfer of Care Note  Patient: Manufacturing engineer  Procedure(s) Performed: MYOMECTOMY, ABDOMINAL APPROACH (Abdomen) APPLICATION OF CELL SAVER (Abdomen)  Patient Location: PACU  Anesthesia Type:General  Level of Consciousness: drowsy and patient cooperative  Airway & Oxygen Therapy: Patient Spontanous Breathing and Patient connected to nasal cannula oxygen  Post-op Assessment: Report given to RN and Post -op Vital signs reviewed and stable  Post vital signs: Reviewed and stable  Last Vitals:  Vitals Value Taken Time  BP 139/91 09/12/23 1642  Temp    Pulse 95 09/12/23 1644  Resp 19 09/12/23 1644  SpO2 97 % 09/12/23 1644  Vitals shown include unfiled device data.  Last Pain:  Vitals:   09/12/23 0933  TempSrc: Oral  PainSc: 0-No pain      Patients Stated Pain Goal: 3 (09/12/23 0933)  Complications: No notable events documented.

## 2023-09-13 LAB — CBC
HCT: 28.6 % — ABNORMAL LOW (ref 36.0–46.0)
Hemoglobin: 9.3 g/dL — ABNORMAL LOW (ref 12.0–15.0)
MCH: 22.4 pg — ABNORMAL LOW (ref 26.0–34.0)
MCHC: 32.5 g/dL (ref 30.0–36.0)
MCV: 68.9 fL — ABNORMAL LOW (ref 80.0–100.0)
Platelets: 279 10*3/uL (ref 150–400)
RBC: 4.15 MIL/uL (ref 3.87–5.11)
RDW: 18.7 % — ABNORMAL HIGH (ref 11.5–15.5)
WBC: 12.3 10*3/uL — ABNORMAL HIGH (ref 4.0–10.5)
nRBC: 0 % (ref 0.0–0.2)

## 2023-09-13 MED ORDER — AMLODIPINE BESYLATE 5 MG PO TABS
5.0000 mg | ORAL_TABLET | Freq: Every day | ORAL | Status: DC
  Administered 2023-09-13 – 2023-09-14 (×2): 5 mg via ORAL
  Filled 2023-09-13 (×2): qty 1

## 2023-09-13 NOTE — Progress Notes (Signed)
 Subjective: Patient reports no problems voiding.   Had elevated BP when tried to get up earlier with low sats. No hx sleep apnea. Had some drinks . Denies flatus Reviewed intra-op findings(see pictures on media)  Objective: I have reviewed patient's vital signs.  vital signs, intake and output, medications, and labs. Vitals:   09/13/23 1133 09/13/23 1201  BP: (!) 127/105   Pulse: (!) 105   Resp: 18 (!) 25  Temp: 98.1 F (36.7 C)   SpO2: 94%    I/O last 3 completed shifts: In: 2250 [I.V.:2000; IV Piggyback:250] Out: 1235 [Urine:1025; Blood:210] Total I/O In: 1635 [I.V.:1635] Out: 650 [Urine:650]  Lab Results  Component Value Date   WBC 12.3 (H) 09/13/2023   HGB 9.3 (L) 09/13/2023   HCT 28.6 (L) 09/13/2023   MCV 68.9 (L) 09/13/2023   PLT 279 09/13/2023   Lab Results  Component Value Date   CREATININE 1.09 (H) 09/09/2023    EXAM General: alert, cooperative, and morbidly obese Resp: clear to auscultation bilaterally Cardio:  tachycardia GI:  soft obese. Some BS sounds on the left. Vertical skin incision. Honeycomb dressing  sl stain Extremities: extremities normal, atraumatic, no cyanosis or edema Vaginal Bleeding: none  Assessment: s/p Procedure(s): MYOMECTOMY, ABDOMINAL APPROACH Chronic HTN Morbid obesity Anemia due to acute blood loss P) ambulate. D/c PCA. Continue amlodipine. Need to eat. Postop pain mgmt.watch BP  Plan: Advance diet Encourage ambulation  LOS: 1 day    Serita Kyle, MD 09/13/2023 12:47 PM    09/13/2023, 12:47 PM

## 2023-09-13 NOTE — Anesthesia Postprocedure Evaluation (Signed)
 Anesthesia Post Note  Patient: Manufacturing engineer  Procedure(s) Performed: MYOMECTOMY, ABDOMINAL APPROACH (Abdomen) APPLICATION OF CELL SAVER (Abdomen)     Patient location during evaluation: PACU Anesthesia Type: General Level of consciousness: awake and alert Pain management: pain level controlled Vital Signs Assessment: post-procedure vital signs reviewed and stable Respiratory status: spontaneous breathing, nonlabored ventilation, respiratory function stable and patient connected to nasal cannula oxygen Cardiovascular status: blood pressure returned to baseline and stable Postop Assessment: no apparent nausea or vomiting Anesthetic complications: no   No notable events documented.          Mariann Barter

## 2023-09-14 ENCOUNTER — Encounter (HOSPITAL_COMMUNITY): Payer: Self-pay | Admitting: Obstetrics and Gynecology

## 2023-09-14 LAB — CBC
HCT: 25.2 % — ABNORMAL LOW (ref 36.0–46.0)
Hemoglobin: 8 g/dL — ABNORMAL LOW (ref 12.0–15.0)
MCH: 21.9 pg — ABNORMAL LOW (ref 26.0–34.0)
MCHC: 31.7 g/dL (ref 30.0–36.0)
MCV: 69 fL — ABNORMAL LOW (ref 80.0–100.0)
Platelets: 245 10*3/uL (ref 150–400)
RBC: 3.65 MIL/uL — ABNORMAL LOW (ref 3.87–5.11)
RDW: 18.8 % — ABNORMAL HIGH (ref 11.5–15.5)
WBC: 12 10*3/uL — ABNORMAL HIGH (ref 4.0–10.5)
nRBC: 0 % (ref 0.0–0.2)

## 2023-09-14 MED ORDER — SENNOSIDES-DOCUSATE SODIUM 8.6-50 MG PO TABS
2.0000 | ORAL_TABLET | Freq: Once | ORAL | Status: AC
  Administered 2023-09-14: 2 via ORAL
  Filled 2023-09-14: qty 2

## 2023-09-14 MED ORDER — OXYCODONE HCL 5 MG PO TABS: 5.0000 mg | ORAL_TABLET | ORAL | 0 refills | Status: AC | PRN

## 2023-09-14 MED ORDER — IBUPROFEN 600 MG PO TABS: 600.0000 mg | ORAL_TABLET | Freq: Four times a day (QID) | ORAL | 11 refills | Status: DC

## 2023-09-14 MED FILL — Cefazolin Sodium-Dextrose IV Solution 2 GM/100ML-4%: INTRAVENOUS | Qty: 100 | Status: AC

## 2023-09-14 MED FILL — Cefazolin Sodium For Inj 1 GM: INTRAMUSCULAR | Qty: 1 | Status: AC

## 2023-09-14 NOTE — Progress Notes (Signed)
 Subjective: Patient reports tolerating PO, + flatus, and no problems voiding.  Denies lightheadedness or dizziness. Uses OY+TC iron supplement at home. Requested something for BM due to last BM 4 days ago  Objective: I have reviewed patient's vital signs.  vital signs and labs. Vitals:   09/14/23 0610 09/14/23 0815  BP: 119/61 (!) 144/66  Pulse: (!) 112 (!) 114  Resp: 17 20  Temp: 98.3 F (36.8 C) 98.1 F (36.7 C)  SpO2: 95% 97%   I/O last 3 completed shifts: In: 1635 [I.V.:1635] Out: 1375 [Urine:1375] No intake/output data recorded.  Lab Results  Component Value Date   WBC 12.0 (H) 09/14/2023   HGB 8.0 (L) 09/14/2023   HCT 25.2 (L) 09/14/2023   MCV 69.0 (L) 09/14/2023   PLT 245 09/14/2023   Lab Results  Component Value Date   CREATININE 1.09 (H) 09/09/2023    EXAM General: alert, cooperative, and morbidly obese Resp: clear to auscultation bilaterally Cardio: regular rate and rhythm GI: incision: clean, dry, and intact and soft non distended. (+) BS Extremities: no edema, redness or tenderness in the calves or thighs Vaginal Bleeding: none  Assessment: s/p Procedure(s): MYOMECTOMY, ABDOMINAL APPROACH APPLICATION OF CELL SAVER: stable, progressing well, and anemia CHronic HTN Anemia related to acute blood loss tachycardia Plan: Discontinue IV fluids Discharge home D/c instructions reviewed F/u 2 wk  D/c  LOS: 2 days    Serita Kyle, MD 09/14/2023 9:50 AM    09/14/2023, 9:50 AM

## 2023-09-14 NOTE — Discharge Instructions (Signed)
Call if temperature greater than equal to 100.4, nothing per vagina for 4-6 weeks or severe nausea vomiting, increased incisional pain , drainage or redness in the incision site, no straining with bowel movements, showers no bath °

## 2023-09-14 NOTE — Discharge Summary (Signed)
 Physician Discharge Summary  Patient ID: Tara Fox MRN: [id number] DOB/AGE: Jan 12, 1991 33 y.o.  Admit date: 09/12/2023 Discharge date: 09/14/2023  Admission Diagnoses:  Discharge Diagnoses:  Principal Problem:   Fibroid uterus Active Problems:   S/P myomectomy   Discharged Condition: {condition:18240}  Hospital Course: ***  Consults: {consultation:18241}  Significant Diagnostic Studies: {diagnostics:18242}  Treatments: {Tx:18249}  Discharge Exam: Blood pressure (!) 144/66, pulse (!) 114, temperature 98.1 F (36.7 C), temperature source Oral, resp. rate 20, height 5\' 9"  (1.753 m), weight 135.6 kg, last menstrual period 09/06/2023, SpO2 97%. {physical WUJW:1191478}  Disposition: Discharge disposition: 01-Home or Self Care       Discharge Instructions     Activity as tolerated - No restrictions   Complete by: As directed    Call MD for:  persistant dizziness or light-headedness   Complete by: As directed    Call MD for:  persistant nausea and vomiting   Complete by: As directed    Call MD for:  severe uncontrolled pain   Complete by: As directed    Call MD for:  temperature >100.4   Complete by: As directed    Diet - low sodium heart healthy   Complete by: As directed    May walk up steps   Complete by: As directed       Allergies as of 09/14/2023   No Known Allergies      Medication List     STOP taking these medications    ferrous sulfate 325 (65 FE) MG tablet       TAKE these medications    acetaminophen 500 MG tablet Commonly known as: TYLENOL Take 1,000 mg by mouth every 6 (six) hours as needed for moderate pain (pain score 4-6).   amLODipine 5 MG tablet Commonly known as: NORVASC TAKE 1 TABLET (5 MG TOTAL) BY MOUTH DAILY.   busPIRone 5 MG tablet Commonly known as: BUSPAR Take 1 tablet (5 mg total) by mouth 3 (three) times daily as needed.   cloNIDine 0.1 MG tablet Commonly known as: CATAPRES Take 0.1 mg by mouth at bedtime.    ibuprofen 600 MG tablet Commonly known as: ADVIL Take 1 tablet (600 mg total) by mouth every 6 (six) hours. What changed:  medication strength how much to take when to take this reasons to take this   lisdexamfetamine 40 MG capsule Commonly known as: VYVANSE Take 40 mg by mouth daily.   metFORMIN 500 MG tablet Commonly known as: GLUCOPHAGE Take 500 mg by mouth daily.   oxyCODONE 5 MG immediate release tablet Commonly known as: Oxy IR/ROXICODONE Take 1 tablet (5 mg total) by mouth every 4 (four) hours as needed for up to 7 days for moderate pain (pain score 4-6) or severe pain (pain score 7-10).   Ozempic (0.25 or 0.5 MG/DOSE) 2 MG/1.5ML Sopn Generic drug: Semaglutide(0.25 or 0.5MG /DOS) Inject 0.25 mg into the skin once a week.   sertraline 25 MG tablet Commonly known as: ZOLOFT TAKE 1 TABLET (25 MG TOTAL) BY MOUTH DAILY.   ZyrTEC Allergy 10 MG tablet Generic drug: cetirizine Take 10 mg by mouth daily as needed for allergies.        Follow-up Information     Maxie Better, MD Follow up in 2 week(s).   Specialty: Obstetrics and Gynecology Why: incision check Contact information: [address] [place] [address] [place] [address] [phone]                 Signed: Serita Kyle  09/14/2023, 9:56 AM

## 2023-09-16 LAB — SURGICAL PATHOLOGY

## 2023-09-17 MED FILL — Sodium Chloride IV Soln 0.9%: INTRAVENOUS | Qty: 1000 | Status: AC

## 2023-09-17 MED FILL — Heparin Sodium (Porcine) Inj 1000 Unit/ML: INTRAMUSCULAR | Qty: 30 | Status: AC

## 2023-09-20 ENCOUNTER — Other Ambulatory Visit: Payer: Self-pay | Admitting: Physician Assistant

## 2023-10-02 ENCOUNTER — Ambulatory Visit: Admitting: Podiatry

## 2023-10-02 ENCOUNTER — Encounter: Payer: Self-pay | Admitting: Podiatry

## 2023-10-02 ENCOUNTER — Ambulatory Visit (INDEPENDENT_AMBULATORY_CARE_PROVIDER_SITE_OTHER)

## 2023-10-02 DIAGNOSIS — M722 Plantar fascial fibromatosis: Secondary | ICD-10-CM

## 2023-10-02 DIAGNOSIS — M216X1 Other acquired deformities of right foot: Secondary | ICD-10-CM | POA: Diagnosis not present

## 2023-10-02 DIAGNOSIS — M216X2 Other acquired deformities of left foot: Secondary | ICD-10-CM

## 2023-10-02 MED ORDER — MELOXICAM 7.5 MG PO TABS: 7.5000 mg | ORAL_TABLET | Freq: Every day | ORAL | 0 refills | Status: AC

## 2023-10-02 MED ORDER — TRIAMCINOLONE ACETONIDE 10 MG/ML IJ SUSP
10.0000 mg | Freq: Once | INTRAMUSCULAR | Status: AC
  Administered 2023-10-02: 10 mg

## 2023-10-02 NOTE — Progress Notes (Signed)
 Chief Complaint  Patient presents with   Foot Pain    "My foot feels like I stepped on a Lego or something,  It hurts in my heel and on the middle of my foot." N - foot pain L - plantar heel and arch D - Monday O - suddenly C - sharp pain, aching A - standing, when I get up in the morning T - Tylenol      HPI: 33 y.o. female presenting today with c/o pain in the bottom of the left heel.  Reports sharp aching throbbing pain.  This started a couple days ago.  It is most severe in the morning when she gets up out of bed or after periods of rest.  Past Medical History:  Diagnosis Date   ADHD (attention deficit hyperactivity disorder)    GAD (generalized anxiety disorder)    Hepatic hemangioma    evaluated by [facility] --- dawn drazen NP (note in epic 11-09-2021)  last MRI in epic 11-14-2021 benign , w/ normal  liver function panel, negative HBV/ HCV   History of positive PPD 2007   per pt positive PPD test in 2007  treated prophylactically   Hypertension 11/09/2021   IDA (iron deficiency anemia)    Intramural uterine fibroid    MDD (major depressive disorder)    Menorrhagia with regular cycle    PCOS (polycystic ovarian syndrome)    Seasonal allergies    Sickle cell trait (HCC)    Wears glasses     Past Surgical History:  Procedure Laterality Date   INGUINAL HERNIA REPAIR Right 1998   IR EMBO TUMOR ORGAN ISCHEMIA INFARCT INC GUIDE ROADMAPPING  04/24/2023   IR FLUORO GUIDED NEEDLE PLC ASPIRATION/INJECTION LOC  04/24/2023   IR RADIOLOGIST EVAL & MGMT  03/25/2023   IR RADIOLOGIST EVAL & MGMT  05/06/2023   MYOMECTOMY N/A 09/12/2023   Procedure: MYOMECTOMY, ABDOMINAL APPROACH;  Surgeon: Ivery Marking, MD;  Location: [facility] OR;  Service: Gynecology;  Laterality: N/A;    No Known Allergies   Physical Exam: General: The patient is alert and oriented x3 in no acute distress.  Dermatology:  No ecchymosis, erythema, or edema bilateral.  No open lesions.    Vascular:  Palpable pedal pulses bilaterally. Capillary refill within normal limits.  No appreciable edema.    Neurological: Light touch sensation intact bilateral.  MMT 5/5 to lower extremity bilateral. Negative Tinel's sign with percussion of the posterior tibial nerve on the affected extremity.    Musculoskeletal Exam:  There is pain on palpation of the plantarmedial & plantarcentral aspect of left heel.  No gaps or nodules within the plantar fascia.  Positive Windlass mechanism bilateral.  Antalgic gait noted with first few steps upon standing.  No pain on palpation of achilles tendon bilateral.  Ankle df less than 10 degrees with knee extended b/l.  Radiographic Exam: Left foot 3 views weightbearing 10/02/2023 Normal osseous mineralization. Joint spaces preserved.  No fractures or osseous irregularities noted.  Assessment/Plan of Care: 1. Plantar fasciitis of left foot   2. Acquired equinus deformity of both feet     Meds ordered this encounter  Medications   triamcinolone  acetonide (KENALOG ) 10 MG/ML injection 10 mg   meloxicam  (MOBIC ) 7.5 MG tablet    Sig: Take 1 tablet (7.5 mg total) by mouth daily.    Dispense:  30 tablet    Refill:  0   None  -Reviewed etiology of plantar fasciitis and equinus with  patient.  Discussed treatment options with patient today, including cortisone injection, NSAID course of treatment, stretching exercises, physical therapy, use of night splint, rest, icing the heel, arch supports/orthotics, and supportive shoe gear.    With the patient's verbal consent, a corticosteroid injection was administered to the left heel, consisting of a mixture of 1% lidocaine  plain, 0.5% Sensorcaine  plain, and Kenalog -10 for a total of 1.5cc administered.  A Band-aid was applied.  Patient tolerated this well.  Power steps dispensed to the patient today.  Did recommend use of over-the-counter night splint.  Discussed importance of good supportive shoe gear.  Starting patient on a course  of oral meloxicam .  Return in about 3 weeks (around 10/23/2023) for Plantar Fasciitis.   Jru Pense L. Lunda Salines, AACFAS [facility]     [address] [place], [place] [address]                Office [phone]  Fax [phone]

## 2023-10-02 NOTE — Patient Instructions (Signed)

## 2023-10-10 ENCOUNTER — Other Ambulatory Visit (HOSPITAL_COMMUNITY): Payer: Self-pay

## 2023-10-10 MED ORDER — AZSTARYS 52.3-10.4 MG PO CAPS
1.0000 | ORAL_CAPSULE | Freq: Every morning | ORAL | 0 refills | Status: DC
  Filled 2023-10-10 – 2023-10-16 (×2): qty 30, 30d supply, fill #0

## 2023-10-13 ENCOUNTER — Other Ambulatory Visit (HOSPITAL_COMMUNITY): Payer: Self-pay

## 2023-10-16 ENCOUNTER — Ambulatory Visit (INDEPENDENT_AMBULATORY_CARE_PROVIDER_SITE_OTHER): Admitting: Podiatry

## 2023-10-16 ENCOUNTER — Encounter: Payer: Self-pay | Admitting: Podiatry

## 2023-10-16 ENCOUNTER — Other Ambulatory Visit (HOSPITAL_COMMUNITY): Payer: Self-pay

## 2023-10-16 DIAGNOSIS — M216X2 Other acquired deformities of left foot: Secondary | ICD-10-CM | POA: Diagnosis not present

## 2023-10-16 DIAGNOSIS — M722 Plantar fascial fibromatosis: Secondary | ICD-10-CM | POA: Diagnosis not present

## 2023-10-16 DIAGNOSIS — M2142 Flat foot [pes planus] (acquired), left foot: Secondary | ICD-10-CM

## 2023-10-16 DIAGNOSIS — M216X1 Other acquired deformities of right foot: Secondary | ICD-10-CM

## 2023-10-16 DIAGNOSIS — M2141 Flat foot [pes planus] (acquired), right foot: Secondary | ICD-10-CM | POA: Diagnosis not present

## 2023-10-16 NOTE — Patient Instructions (Signed)

## 2023-10-16 NOTE — Progress Notes (Unsigned)
 Chief Complaint  Patient presents with   Plantar Fasciitis    Rm 16/ f/u 2wks plantar fascitis left/ pain 1/10/ 90% improved/not diabetic/ treatment meloxicam /injections/stretching exercise.    HPI: 33 y.o. female presenting today following up for left foot plantar fasciitis.  She reports significant improvement for this, close 90%.  She does complain of some pain to the lateral column of her foot today with some neuritis like symptoms.  She has been using power step inserts.  Past Medical History:  Diagnosis Date   ADHD (attention deficit hyperactivity disorder)    GAD (generalized anxiety disorder)    Hepatic hemangioma    evaluated by [facility] --- dawn drazen NP (note in epic 11-09-2021)  last MRI in epic 11-14-2021 benign , w/ normal  liver function panel, negative HBV/ HCV   History of positive PPD 2007   per pt positive PPD test in 2007  treated prophylactically   Hypertension 11/09/2021   IDA (iron deficiency anemia)    Intramural uterine fibroid    MDD (major depressive disorder)    Menorrhagia with regular cycle    PCOS (polycystic ovarian syndrome)    Seasonal allergies    Sickle cell trait (HCC)    Wears glasses     Past Surgical History:  Procedure Laterality Date   INGUINAL HERNIA REPAIR Right 1998   IR EMBO TUMOR ORGAN ISCHEMIA INFARCT INC GUIDE ROADMAPPING  04/24/2023   IR FLUORO GUIDED NEEDLE PLC ASPIRATION/INJECTION LOC  04/24/2023   IR RADIOLOGIST EVAL & MGMT  03/25/2023   IR RADIOLOGIST EVAL & MGMT  05/06/2023   MYOMECTOMY N/A 09/12/2023   Procedure: MYOMECTOMY, ABDOMINAL APPROACH;  Surgeon: Ivery Marking, MD;  Location: [facility] OR;  Service: Gynecology;  Laterality: N/A;    No Known Allergies   Physical Exam: General: The patient is alert and oriented x3 in no acute distress.  Dermatology:  No ecchymosis, erythema, or edema bilateral.  No open lesions.    Vascular: Palpable pedal pulses bilaterally. Capillary refill within normal limits.   No appreciable edema.    Neurological: Light touch sensation intact bilateral.  MMT 5/5 to lower extremity bilateral. Negative Tinel's sign with percussion of the posterior tibial nerve on the affected extremity.    Musculoskeletal Exam:  There is no pain on palpation of the plantarmedial & plantarcentral aspect of left heel today.  No gaps or nodules within the plantar fascia.  Positive Windlass mechanism bilateral.  Ankle df less than 10 degrees with knee extended b/l. Some mild tenderness on palpation of left 5th met base and lateral column plantarly with some neuritis symptoms  Radiographic Exam: Left foot 3 views weightbearing 10/02/2023 Normal osseous mineralization. Joint spaces preserved.  No fractures or osseous irregularities noted.  Assessment/Plan of Care: 1. Plantar fasciitis of left foot   2. Acquired equinus deformity of both feet   3. Pes planus of both feet     No orders of the defined types were placed in this encounter.  FOR HOME USE ONLY DME CUSTOM ORTHOTICS  Encouraged with the patient's progression today.  No heel pain today.  Continue the course of oral meloxicam  as previously prescribed.  Rx sent in to [facility] clinic for custom orthotics for plantar fasciitis, pes planus foot type.  Believe lateral column pain will improve more accustomed with using the over-the-counter power step inserts.  Continue use in the meantime.  Return to clinic in the next 3-4 weeks at this does not improve or  worsens. Otherwise return in about 3 months for orthotics check.  Return in about 3 months (around 01/16/2024) for Plantar Fasciitis, orthotics check.   Maronda Caison L. Lunda Salines, AACFAS [facility]     [address] [place], [place] [address]                Office [phone]  Fax [phone]

## 2023-10-18 ENCOUNTER — Encounter: Payer: Self-pay | Admitting: Podiatry

## 2023-11-03 ENCOUNTER — Other Ambulatory Visit: Payer: Self-pay | Admitting: Physician Assistant

## 2024-01-16 ENCOUNTER — Ambulatory Visit: Admitting: Podiatry

## 2024-01-16 DIAGNOSIS — Z91199 Patient's noncompliance with other medical treatment and regimen due to unspecified reason: Secondary | ICD-10-CM

## 2024-01-16 DIAGNOSIS — M722 Plantar fascial fibromatosis: Secondary | ICD-10-CM

## 2024-01-16 NOTE — Progress Notes (Signed)
Patient did not show for scheduled appointment today.

## 2024-02-03 ENCOUNTER — Ambulatory Visit (INDEPENDENT_AMBULATORY_CARE_PROVIDER_SITE_OTHER): Payer: No Typology Code available for payment source | Admitting: Physician Assistant

## 2024-02-03 ENCOUNTER — Encounter: Payer: Self-pay | Admitting: Physician Assistant

## 2024-02-03 VITALS — BP 130/96 | HR 102 | Temp 97.2°F | Ht 69.0 in | Wt 324.0 lb

## 2024-02-03 DIAGNOSIS — E669 Obesity, unspecified: Secondary | ICD-10-CM

## 2024-02-03 DIAGNOSIS — I10 Essential (primary) hypertension: Secondary | ICD-10-CM | POA: Diagnosis not present

## 2024-02-03 DIAGNOSIS — Z0000 Encounter for general adult medical examination without abnormal findings: Secondary | ICD-10-CM | POA: Diagnosis not present

## 2024-02-03 DIAGNOSIS — F419 Anxiety disorder, unspecified: Secondary | ICD-10-CM

## 2024-02-03 DIAGNOSIS — F32A Depression, unspecified: Secondary | ICD-10-CM

## 2024-02-03 DIAGNOSIS — E282 Polycystic ovarian syndrome: Secondary | ICD-10-CM | POA: Diagnosis not present

## 2024-02-03 LAB — LIPID PANEL
Cholesterol: 136 mg/dL (ref 0–200)
HDL: 42.3 mg/dL (ref >39.00)
LDL Cholesterol: 68 mg/dL (ref 0–99)
NonHDL: 93.27
Total CHOL/HDL Ratio: 3
Triglycerides: 128 mg/dL (ref 0.0–149.0)
VLDL: 25.6 mg/dL (ref 0.0–40.0)

## 2024-02-03 LAB — COMPREHENSIVE METABOLIC PANEL WITH GFR
ALT: 13 U/L (ref 0–35)
AST: 15 U/L (ref 0–37)
Albumin: 3.8 g/dL (ref 3.5–5.2)
Alkaline Phosphatase: 68 U/L (ref 39–117)
BUN: 9 mg/dL (ref 6–23)
CO2: 24 meq/L (ref 19–32)
Calcium: 9.3 mg/dL (ref 8.4–10.5)
Chloride: 106 meq/L (ref 96–112)
Creatinine, Ser: 0.99 mg/dL (ref 0.40–1.20)
GFR: 74.08 mL/min (ref >60.00)
Glucose, Bld: 104 mg/dL — ABNORMAL HIGH (ref 70–99)
Potassium: 3.8 meq/L (ref 3.5–5.1)
Sodium: 139 meq/L (ref 135–145)
Total Bilirubin: 0.3 mg/dL (ref 0.2–1.2)
Total Protein: 7.5 g/dL (ref 6.0–8.3)

## 2024-02-03 LAB — CBC WITH DIFFERENTIAL/PLATELET
Basophils Absolute: 0 K/uL (ref 0.0–0.1)
Basophils Relative: 0.4 % (ref 0.0–3.0)
Eosinophils Absolute: 0.1 K/uL (ref 0.0–0.7)
Eosinophils Relative: 1.5 % (ref 0.0–5.0)
HCT: 36.9 % (ref 36.0–46.0)
Hemoglobin: 11.6 g/dL — ABNORMAL LOW (ref 12.0–15.0)
Lymphocytes Relative: 26 % (ref 12.0–46.0)
Lymphs Abs: 1.8 K/uL (ref 0.7–4.0)
MCHC: 31.3 g/dL (ref 30.0–36.0)
MCV: 69.5 fl — ABNORMAL LOW (ref 78.0–100.0)
Monocytes Absolute: 0.4 K/uL (ref 0.1–1.0)
Monocytes Relative: 5.6 % (ref 3.0–12.0)
Neutro Abs: 4.7 K/uL (ref 1.4–7.7)
Neutrophils Relative %: 66.5 % (ref 43.0–77.0)
Platelets: 341 K/uL (ref 150.0–400.0)
RBC: 5.32 Mil/uL — ABNORMAL HIGH (ref 3.87–5.11)
RDW: 19.2 % — ABNORMAL HIGH (ref 11.5–15.5)
WBC: 7 K/uL (ref 4.0–10.5)

## 2024-02-03 LAB — HEMOGLOBIN A1C: Hgb A1c MFr Bld: 7.1 % — ABNORMAL HIGH (ref 4.6–6.5)

## 2024-02-03 LAB — TSH: TSH: 3.24 u[IU]/mL (ref 0.35–5.50)

## 2024-02-03 MED ORDER — BUSPIRONE HCL 5 MG PO TABS: 5.0000 mg | ORAL_TABLET | Freq: Three times a day (TID) | ORAL | 1 refills | Status: DC | PRN

## 2024-02-03 NOTE — Progress Notes (Signed)
 Subjective:    Tara Fox is a 33 y.o. female and is here for a comprehensive physical exam.  HPI  There are no preventive care reminders to display for this patient.  Discussed the use of AI scribe software for clinical note transcription with the patient, who gave verbal consent to proceed.  History of Present Illness Tara Fox is a 33 year old female who presents for Comprehensive Physical Exam (CPE) preventive care annual visit.  She underwent myomectomy surgery at the end of March without complications. Since then, she has gained approximately 25 pounds, although her clothing fit remains unchanged. She denies changes in eating habits that would justify the weight gain but notes increased consumption of regular soda. She does not have a scale at home but is weighed at her ADHD specialist's office.  She has experienced difficulty in getting her ADHD medication approved by insurance. Vyvanse  was ineffective, and Focalin  was not approved. She has not taken any stimulants recently. Her blood pressure was stable at her last ADHD doctor visit. She is currently taking Amlodipine  5 mg daily.   She is on Zoloft  25 mg daily and feels the dose is appropriate. She uses 5 mg Buspar  rarely, as needed, and finds it effective. No headaches, tingling, tremors, or carpal tunnel symptoms. She reports good sleep quality and is seeing both a dentist and an eye doctor regularly.  Plantar fasciitis remains problematic. She is attempting to increase her physical activity but has not been successful in going to the gym regularly.  She has a history of PCOS and was previously prescribed Metformin , which caused gastrointestinal issues, leading to irregular use.  Health Maintenance: Immunizations -- plan Colonoscopy -- n/a Mammogram -- n/a PAP -- utd Bone Density -- N/A  Diet -- variety as able Exercise -- trying to get into the   Sleep habits -- overall stable Mood -- no major concerns  UTD with  dentist? - yes UTD with eye doctor? - yes  Weight history: Wt Readings from Last 10 Encounters:  02/03/24 (!) 324 lb (147 kg)  09/12/23 299 lb (135.6 kg)  08/04/23 299 lb 6.1 oz (135.8 kg)  01/27/23 294 lb 4 oz (133.5 kg)  12/03/22 297 lb 6.4 oz (134.9 kg)  10/23/22 298 lb 8 oz (135.4 kg)  09/24/22 (!) 302 lb 6.1 oz (137.2 kg)  07/09/21 (!) 327 lb 6.1 oz (148.5 kg)  08/11/20 (!) 321 lb (145.6 kg)  05/16/16 (!) 325 lb (147.4 kg)   Body mass index is 47.85 kg/m. Patient's last menstrual period was 01/18/2024 (exact date).  Alcohol use:  reports that she does not currently use alcohol.  Tobacco use:  Tobacco Use: Low Risk  (02/03/2024)   Patient History    Smoking Tobacco Use: Never    Smokeless Tobacco Use: Never    Passive Exposure: Not on file   Eligible for lung cancer screening? no     02/03/2024    9:03 AM  Depression screen PHQ 2/9  Decreased Interest 0  Down, Depressed, Hopeless 1  PHQ - 2 Score 1  Altered sleeping 0  Tired, decreased energy 1  Change in appetite 0  Feeling bad or failure about yourself  0  Trouble concentrating 1  Moving slowly or fidgety/restless 0  Suicidal thoughts 0  PHQ-9 Score 3  Difficult doing work/chores Somewhat difficult     Other providers/specialists: Patient Care Team: Job Lukes, [place] as PCP - General (Physician Assistant)    PMHx, SurgHx, SocialHx, Medications, and  Allergies were reviewed in the Visit Navigator and updated as appropriate.   Past Medical History:  Diagnosis Date   ADHD (attention deficit hyperactivity disorder)    GAD (generalized anxiety disorder)    Hepatic hemangioma    evaluated by [facility] --- dawn drazen NP (note in epic 11-09-2021)  last MRI in epic 11-14-2021 benign , w/ normal  liver function panel, negative HBV/ HCV   History of positive PPD 2007   per pt positive PPD test in 2007  treated prophylactically   Hypertension 11/09/2021   IDA (iron deficiency anemia)    Intramural  uterine fibroid    MDD (major depressive disorder)    Menorrhagia with regular cycle    PCOS (polycystic ovarian syndrome)    Seasonal allergies    Sickle cell trait (HCC)    Wears glasses      Past Surgical History:  Procedure Laterality Date   INGUINAL HERNIA REPAIR Right 1998   IR EMBO TUMOR ORGAN ISCHEMIA INFARCT INC GUIDE ROADMAPPING  04/24/2023   IR FLUORO GUIDED NEEDLE PLC ASPIRATION/INJECTION LOC  04/24/2023   IR RADIOLOGIST EVAL & MGMT  03/25/2023   IR RADIOLOGIST EVAL & MGMT  05/06/2023   MYOMECTOMY N/A 09/12/2023   Procedure: MYOMECTOMY, ABDOMINAL APPROACH;  Surgeon: Rutherford Gain, MD;  Location: [facility] OR;  Service: Gynecology;  Laterality: N/A;     Family History  Problem Relation Age of Onset   Diabetes Mother    Hypertension Mother    Alcohol abuse Father    Hypertension Father    Sickle cell anemia Brother    Stroke Brother        x 3    Cancer Maternal Aunt        breast CA, 2 aunts   Diabetes Maternal Aunt    Colon cancer Maternal Grandmother 61   Dementia Paternal Grandmother     Social History   Tobacco Use   Smoking status: Never   Smokeless tobacco: Never  Vaping Use   Vaping status: Former   Devices: quit 2012  Substance Use Topics   Alcohol use: Not Currently    Comment: occasional   Drug use: Never    Review of Systems:   Review of Systems  Constitutional:  Negative for chills, fever, malaise/fatigue and weight loss.  HENT:  Negative for hearing loss, sinus pain and sore throat.   Respiratory:  Negative for cough and hemoptysis.   Cardiovascular:  Negative for chest pain, palpitations, leg swelling and PND.  Gastrointestinal:  Negative for abdominal pain, constipation, diarrhea, heartburn, nausea and vomiting.  Genitourinary:  Negative for dysuria, frequency and urgency.  Musculoskeletal:  Negative for back pain, myalgias and neck pain.  Skin:  Negative for itching and rash.  Neurological:  Negative for dizziness, tingling,  seizures and headaches.  Endo/Heme/Allergies:  Negative for polydipsia.  Psychiatric/Behavioral:  Negative for depression. The patient is not nervous/anxious.     Objective:   BP (!) 130/96 (BP Location: Left Arm, Patient Position: Sitting, Cuff Size: Large)   Pulse (!) 102   Temp (!) 97.2 F (36.2 C) (Temporal)   Ht 5' 9 (1.753 m)   Wt (!) 324 lb (147 kg)   LMP 01/18/2024 (Exact Date)   SpO2 96%   BMI 47.85 kg/m  Body mass index is 47.85 kg/m.   General Appearance:    Alert, cooperative, no distress, appears stated age  Head:    Normocephalic, without obvious abnormality, atraumatic  Eyes:    PERRL, conjunctiva/corneas  clear, EOM's intact, fundi    benign, both eyes  Ears:    Normal TM's and external ear canals, both ears  Nose:   Nares normal, septum midline, mucosa normal, no drainage    or sinus tenderness  Throat:   Lips, mucosa, and tongue normal; teeth and gums normal  Neck:   Supple, symmetrical, trachea midline, no adenopathy;    thyroid :  no enlargement/tenderness/nodules; no carotid   bruit or JVD  Back:     Symmetric, no curvature, ROM normal, no CVA tenderness  Lungs:     Clear to auscultation bilaterally, respirations unlabored  Chest Wall:    No tenderness or deformity   Heart:    Regular rate and rhythm, S1 and S2 normal, no murmur, rub or gallop  Breast Exam:    Deferred   Abdomen:     Soft, non-tender, bowel sounds active all four quadrants,    no masses, no organomegaly  Genitalia:    Deferred   Extremities:   Extremities normal, atraumatic, no cyanosis or edema  Pulses:   2+ and symmetric all extremities  Skin:   Skin color, texture, turgor normal, no rashes or lesions  Lymph nodes:   Cervical, supraclavicular, and axillary nodes normal  Neurologic:   CNII-XII intact, normal strength, sensation and reflexes    throughout    Assessment/Plan:   Assessment and Plan Assessment & Plan Comprehensive Physical Exam (CPE) preventive care annual  visit Today patient counseled on age appropriate routine health concerns for screening and prevention, each reviewed and up to date or declined. Immunizations reviewed and up to date or declined. Labs ordered and reviewed. Risk factors for depression reviewed and negative. Hearing function and visual acuity are intact. ADLs screened and addressed as needed. Functional ability and level of safety reviewed and appropriate. Education, counseling and referrals performed based on assessed risks today. Patient provided with a copy of personalized plan for preventive services.   Primary hypertension Blood pressure controlled at 120/80 mmHg. No peripheral edema noted. - Continue Amlodipine  5 mg oral daily. - Recheck blood pressure during the visit.  Depression and anxiety Sertraline  25 mg daily effective. Buspirone  effective as needed. - Continue Sertraline  25 mg oral daily. - Refill Buspirone  5 mg oral three times daily as needed.  Abnormal weight gain Gained 20 pounds post-surgery. No fluid retention. Increased soda consumption noted. - Screen for diabetes. - Check thyroid  function. - Encourage reduction in soda consumption.  History of polycystic ovary syndrome (PCOS) Previously discontinued Metformin  due to gastrointestinal side effects. Open to restarting if needed. - Consider Metformin  if blood sugars are elevated. - Close follow up with gynecology    Lucie Buttner, PA-C  [facility]

## 2024-02-05 ENCOUNTER — Ambulatory Visit: Payer: Self-pay | Admitting: Physician Assistant

## 2024-02-05 NOTE — Telephone Encounter (Signed)
 Please see message, what dose of Ozempic  do you want?

## 2024-02-06 ENCOUNTER — Telehealth: Payer: Self-pay | Admitting: *Deleted

## 2024-02-06 ENCOUNTER — Encounter: Payer: Self-pay | Admitting: Physician Assistant

## 2024-02-06 ENCOUNTER — Telehealth: Payer: Self-pay

## 2024-02-06 ENCOUNTER — Other Ambulatory Visit (HOSPITAL_COMMUNITY): Payer: Self-pay

## 2024-02-06 DIAGNOSIS — E119 Type 2 diabetes mellitus without complications: Secondary | ICD-10-CM | POA: Insufficient documentation

## 2024-02-06 MED ORDER — OZEMPIC (0.25 OR 0.5 MG/DOSE) 2 MG/3ML ~~LOC~~ SOPN: 0.2500 mg | PEN_INJECTOR | SUBCUTANEOUS | 2 refills | Status: AC

## 2024-02-06 NOTE — Telephone Encounter (Signed)
 Pharmacy Patient Advocate Encounter   Received notification from Pt Calls Messages that prior authorization for Ozempic  (0.25 or 0.5 MG/DOSE) 2MG /3ML pen-injectors is required/requested.   Insurance verification completed.   The patient is insured through [employer] [facility] .   Per test claim: PA required; PA submitted to above mentioned insurance via Latent Key/confirmation #/EOC [id number] Status is pending

## 2024-02-06 NOTE — Telephone Encounter (Signed)
 Pharmacy Patient Advocate Encounter  Received notification from [employer] [facility] that Prior Authorization for  Ozempic  (0.25 or 0.5 MG/DOSE) 2MG /3ML pen-injectors  has been APPROVED from 02/05/24 to 02/05/27   PA #/Case ID/Reference #: [id number]

## 2024-02-06 NOTE — Telephone Encounter (Signed)
 Please do PA for Ozempic  0.25 mg weekly injection. Newly diagnosed with Diabetes. Please make sure to send lab result.

## 2024-03-08 ENCOUNTER — Other Ambulatory Visit: Payer: Self-pay | Admitting: Physician Assistant

## 2024-05-11 ENCOUNTER — Other Ambulatory Visit: Payer: Self-pay | Admitting: Physician Assistant

## 2024-08-05 ENCOUNTER — Ambulatory Visit: Admitting: Physician Assistant

## 2025-02-08 ENCOUNTER — Encounter: Admitting: Physician Assistant
